# Patient Record
Sex: Male | Born: 1985 | Race: White | Hispanic: No | Marital: Single | Smoking: Former smoker
Health system: Southern US, Community
[De-identification: ages and names within clinical notes are randomized; demographics above are authoritative.]

## PROBLEM LIST (undated history)

## (undated) DIAGNOSIS — Z720 Tobacco use: Secondary | ICD-10-CM

## (undated) DIAGNOSIS — R569 Unspecified convulsions: Secondary | ICD-10-CM

## (undated) HISTORY — PX: MOUTH SURGERY: SHX715

---

## 2010-07-17 ENCOUNTER — Ambulatory Visit (INDEPENDENT_AMBULATORY_CARE_PROVIDER_SITE_OTHER): Payer: Managed Care, Other (non HMO)

## 2010-07-17 ENCOUNTER — Inpatient Hospital Stay (INDEPENDENT_AMBULATORY_CARE_PROVIDER_SITE_OTHER)
Admission: RE | Admit: 2010-07-17 | Discharge: 2010-07-17 | Disposition: A | Discharge status: Home / Self Care | Payer: Managed Care, Other (non HMO) | Source: Ambulatory Visit | Attending: Emergency Medicine | Admitting: Emergency Medicine

## 2010-07-17 DIAGNOSIS — S4350XA Sprain of unspecified acromioclavicular joint, initial encounter: Secondary | ICD-10-CM

## 2010-11-11 ENCOUNTER — Encounter: Payer: Self-pay | Admitting: *Deleted

## 2010-11-11 ENCOUNTER — Emergency Department (INDEPENDENT_AMBULATORY_CARE_PROVIDER_SITE_OTHER): Payer: Self-pay

## 2010-11-11 ENCOUNTER — Emergency Department (HOSPITAL_BASED_OUTPATIENT_CLINIC_OR_DEPARTMENT_OTHER)
Admission: EM | Admit: 2010-11-11 | Discharge: 2010-11-11 | Disposition: A | Discharge status: Home / Self Care | Payer: Self-pay | Attending: Emergency Medicine | Admitting: Emergency Medicine

## 2010-11-11 DIAGNOSIS — M79609 Pain in unspecified limb: Secondary | ICD-10-CM

## 2010-11-11 DIAGNOSIS — W2209XA Striking against other stationary object, initial encounter: Secondary | ICD-10-CM | POA: Insufficient documentation

## 2010-11-11 DIAGNOSIS — S92919A Unspecified fracture of unspecified toe(s), initial encounter for closed fracture: Secondary | ICD-10-CM

## 2010-11-11 DIAGNOSIS — IMO0002 Reserved for concepts with insufficient information to code with codable children: Secondary | ICD-10-CM

## 2010-11-11 DIAGNOSIS — F17200 Nicotine dependence, unspecified, uncomplicated: Secondary | ICD-10-CM | POA: Insufficient documentation

## 2010-11-11 NOTE — ED Notes (Signed)
Patients toes buddytaped. He voices no additional concerns at this time
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
## 2010-11-13 NOTE — ED Provider Notes (Signed)
History/physical exam/procedure(s) were performed by non-physician practitioner and as supervising physician I was immediately available for consultation/collaboration. I have reviewed all notes and am in agreement with care and plan.   Hilario Quarry, MD 11/13/10 1215

## 2016-11-01 ENCOUNTER — Encounter (HOSPITAL_COMMUNITY): Payer: Self-pay | Admitting: Emergency Medicine

## 2016-11-01 ENCOUNTER — Observation Stay (HOSPITAL_COMMUNITY)
Admission: EM | Admit: 2016-11-01 | Discharge: 2016-11-02 | Disposition: A | Discharge status: Home / Self Care | Payer: Self-pay | Attending: Internal Medicine | Admitting: Internal Medicine

## 2016-11-01 DIAGNOSIS — G40409 Other generalized epilepsy and epileptic syndromes, not intractable, without status epilepticus: Principal | ICD-10-CM | POA: Insufficient documentation

## 2016-11-01 DIAGNOSIS — Z79899 Other long term (current) drug therapy: Secondary | ICD-10-CM | POA: Insufficient documentation

## 2016-11-01 DIAGNOSIS — Z720 Tobacco use: Secondary | ICD-10-CM

## 2016-11-01 DIAGNOSIS — F17210 Nicotine dependence, cigarettes, uncomplicated: Secondary | ICD-10-CM | POA: Insufficient documentation

## 2016-11-01 DIAGNOSIS — M6282 Rhabdomyolysis: Secondary | ICD-10-CM | POA: Insufficient documentation

## 2016-11-01 DIAGNOSIS — R569 Unspecified convulsions: Secondary | ICD-10-CM

## 2016-11-01 DIAGNOSIS — N179 Acute kidney failure, unspecified: Secondary | ICD-10-CM | POA: Insufficient documentation

## 2016-11-01 DIAGNOSIS — G40909 Epilepsy, unspecified, not intractable, without status epilepticus: Secondary | ICD-10-CM

## 2016-11-01 HISTORY — DX: Tobacco use: Z72.0

## 2016-11-01 HISTORY — DX: Unspecified convulsions: R56.9

## 2016-11-01 NOTE — ED Triage Notes (Signed)
Pt BIB EMS from work after witness grand mal seizure, lasting approx 1min. Postictal for EMS arrival. Prior hx of seizure, but hasn't had one in 2-3071yrs. Had episode of bradycardia in the 40s for EMS, pale & diaphoretic. Given NS and placed into trendelenburg position immediately, and seemed to improve. Peak T waves reported on EKG, with variable HR.

## 2016-11-02 ENCOUNTER — Encounter (HOSPITAL_COMMUNITY): Payer: Self-pay | Admitting: Internal Medicine

## 2016-11-02 ENCOUNTER — Observation Stay (HOSPITAL_COMMUNITY): Payer: Self-pay

## 2016-11-02 DIAGNOSIS — Z720 Tobacco use: Secondary | ICD-10-CM | POA: Diagnosis present

## 2016-11-02 DIAGNOSIS — R569 Unspecified convulsions: Secondary | ICD-10-CM

## 2016-11-02 DIAGNOSIS — G40909 Epilepsy, unspecified, not intractable, without status epilepticus: Secondary | ICD-10-CM | POA: Insufficient documentation

## 2016-11-02 DIAGNOSIS — N179 Acute kidney failure, unspecified: Secondary | ICD-10-CM | POA: Diagnosis present

## 2016-11-02 LAB — CBG MONITORING, ED: GLUCOSE-CAPILLARY: 103 mg/dL — AB (ref 65–99)

## 2016-11-02 LAB — BASIC METABOLIC PANEL
ANION GAP: 8 (ref 5–15)
Anion gap: 8 (ref 5–15)
BUN: 11 mg/dL (ref 6–20)
BUN: 13 mg/dL (ref 6–20)
CALCIUM: 9.6 mg/dL (ref 8.9–10.3)
CO2: 25 mmol/L (ref 22–32)
CO2: 26 mmol/L (ref 22–32)
Calcium: 8.8 mg/dL — ABNORMAL LOW (ref 8.9–10.3)
Chloride: 107 mmol/L (ref 101–111)
Chloride: 108 mmol/L (ref 101–111)
Creatinine, Ser: 1.1 mg/dL (ref 0.61–1.24)
Creatinine, Ser: 1.29 mg/dL — ABNORMAL HIGH (ref 0.61–1.24)
GFR calc Af Amer: >60 mL/min (ref >60)
GFR calc non Af Amer: >60 mL/min (ref >60)
GLUCOSE: 116 mg/dL — AB (ref 65–99)
GLUCOSE: 99 mg/dL (ref 65–99)
POTASSIUM: 3.5 mmol/L (ref 3.5–5.1)
Potassium: 4 mmol/L (ref 3.5–5.1)
SODIUM: 142 mmol/L (ref 135–145)
Sodium: 140 mmol/L (ref 135–145)

## 2016-11-02 LAB — RAPID URINE DRUG SCREEN, HOSP PERFORMED
Amphetamines: NOT DETECTED
Barbiturates: NOT DETECTED
Benzodiazepines: NOT DETECTED
COCAINE: NOT DETECTED
Opiates: NOT DETECTED
Tetrahydrocannabinol: NOT DETECTED

## 2016-11-02 LAB — CBC
HEMATOCRIT: 42.8 % (ref 39.0–52.0)
HEMATOCRIT: 39.2 % (ref 39.0–52.0)
HEMOGLOBIN: 13.4 g/dL (ref 13.0–17.0)
Hemoglobin: 14.9 g/dL (ref 13.0–17.0)
MCH: 32.7 pg (ref 26.0–34.0)
MCH: 32 pg (ref 26.0–34.0)
MCHC: 34.8 g/dL (ref 30.0–36.0)
MCHC: 34.2 g/dL (ref 30.0–36.0)
MCV: 94.1 fL (ref 78.0–100.0)
MCV: 93.6 fL (ref 78.0–100.0)
PLATELETS: 205 10*3/uL (ref 150–400)
Platelets: 204 10*3/uL (ref 150–400)
RBC: 4.55 MIL/uL (ref 4.22–5.81)
RBC: 4.19 MIL/uL — ABNORMAL LOW (ref 4.22–5.81)
RDW: 12.5 % (ref 11.5–15.5)
RDW: 12.6 % (ref 11.5–15.5)
WBC: 7.7 10*3/uL (ref 4.0–10.5)
WBC: 10.2 10*3/uL (ref 4.0–10.5)

## 2016-11-02 LAB — SODIUM, URINE, RANDOM: Sodium, Ur: 124 mmol/L

## 2016-11-02 LAB — PHOSPHORUS
PHOSPHORUS: 3.2 mg/dL (ref 2.5–4.6)
Phosphorus: <1 mg/dL — CL (ref 2.5–4.6)
Phosphorus: 2.7 mg/dL (ref 2.5–4.6)

## 2016-11-02 LAB — URINALYSIS, ROUTINE W REFLEX MICROSCOPIC
Bilirubin Urine: NEGATIVE
Glucose, UA: NEGATIVE mg/dL
Hgb urine dipstick: NEGATIVE
KETONES UR: NEGATIVE mg/dL
LEUKOCYTES UA: NEGATIVE
NITRITE: NEGATIVE
PH: 5 (ref 5.0–8.0)
Protein, ur: NEGATIVE mg/dL
SPECIFIC GRAVITY, URINE: 1.016 (ref 1.005–1.030)

## 2016-11-02 LAB — CREATININE, URINE, RANDOM: Creatinine, Urine: 123.6 mg/dL

## 2016-11-02 LAB — CK: CK TOTAL: 849 U/L — AB (ref 49–397)

## 2016-11-02 LAB — MAGNESIUM: Magnesium: 2.2 mg/dL (ref 1.7–2.4)

## 2016-11-02 LAB — HIV ANTIBODY (ROUTINE TESTING W REFLEX): HIV Screen 4th Generation wRfx: NONREACTIVE

## 2016-11-02 MED ORDER — LEVETIRACETAM 500 MG/5ML IV SOLN
500.0000 mg | Freq: Two times a day (BID) | INTRAVENOUS | Status: DC
  Administered 2016-11-02: 500 mg via INTRAVENOUS
  Filled 2016-11-02 (×2): qty 5

## 2016-11-02 MED ORDER — ACETAMINOPHEN 325 MG PO TABS: 650.0000 mg | ORAL_TABLET | Freq: Four times a day (QID) | ORAL | Status: DC | PRN

## 2016-11-02 MED ORDER — ZOLPIDEM TARTRATE 5 MG PO TABS: 5.0000 mg | ORAL_TABLET | Freq: Every evening | ORAL | Status: DC | PRN

## 2016-11-02 MED ORDER — NICOTINE 21 MG/24HR TD PT24
21.0000 mg | MEDICATED_PATCH | Freq: Every day | TRANSDERMAL | Status: DC
  Administered 2016-11-02: 21 mg via TRANSDERMAL
  Filled 2016-11-02: qty 1

## 2016-11-02 MED ORDER — ENOXAPARIN SODIUM 40 MG/0.4ML ~~LOC~~ SOLN: 40.0000 mg | SUBCUTANEOUS | Status: DC

## 2016-11-02 MED ORDER — LORAZEPAM 2 MG/ML IJ SOLN: 1.0000 mg | INTRAMUSCULAR | Status: DC | PRN

## 2016-11-02 MED ORDER — ONDANSETRON HCL 4 MG PO TABS: 4.0000 mg | ORAL_TABLET | Freq: Four times a day (QID) | ORAL | Status: DC | PRN

## 2016-11-02 MED ORDER — LEVETIRACETAM 500 MG PO TABS: 500.0000 mg | ORAL_TABLET | Freq: Two times a day (BID) | ORAL | 0 refills | Status: DC

## 2016-11-02 MED ORDER — SODIUM CHLORIDE 0.9 % IV SOLN
INTRAVENOUS | Status: DC
  Administered 2016-11-02: 11:00:00 via INTRAVENOUS

## 2016-11-02 MED ORDER — POTASSIUM CHLORIDE CRYS ER 20 MEQ PO TBCR
40.0000 meq | EXTENDED_RELEASE_TABLET | Freq: Once | ORAL | Status: AC
Start: 2016-11-02 — End: 2016-11-02
  Administered 2016-11-02: 40 meq via ORAL
  Filled 2016-11-02: qty 2

## 2016-11-02 MED ORDER — SODIUM PHOSPHATES 45 MMOLE/15ML IV SOLN
20.0000 mmol | Freq: Once | INTRAVENOUS | Status: AC
  Administered 2016-11-02: 20 mmol via INTRAVENOUS
  Filled 2016-11-02: qty 6.67

## 2016-11-02 MED ORDER — ACETAMINOPHEN 650 MG RE SUPP: 650.0000 mg | Freq: Four times a day (QID) | RECTAL | Status: DC | PRN

## 2016-11-02 MED ORDER — NICOTINE 21 MG/24HR TD PT24: 21.0000 mg | MEDICATED_PATCH | Freq: Every day | TRANSDERMAL | 0 refills | Status: DC

## 2016-11-02 MED ORDER — LEVETIRACETAM 500 MG/5ML IV SOLN
1000.0000 mg | Freq: Once | INTRAVENOUS | Status: AC
  Administered 2016-11-02: 1000 mg via INTRAVENOUS
  Filled 2016-11-02: qty 10

## 2016-11-02 MED ORDER — ONDANSETRON HCL 4 MG/2ML IJ SOLN: 4.0000 mg | Freq: Four times a day (QID) | INTRAMUSCULAR | Status: DC | PRN

## 2016-11-02 NOTE — Discharge Summary (Signed)
Physician Discharge Summary  Blake Edwards UEA:[id number] DOB: 07/15/1985 DOA: 11/01/2016  PCP: Daisy Florooss, Charles Alan, MD  Admit date: 11/01/2016 Discharge date: 11/02/2016  Time spent: 45 minutes  Recommendations for Outpatient Follow-up:  Patient will be discharged to home.  Patient will need to follow up with primary care provider within one week of discharge, repeat BMP, magnesium, phosphate, CK levels in one week. Follow up with parathyroid hormond level and discuss low phosphate. Follow up with neurologist in 1-2 weeks. Patient should continue medications as prescribed.  Patient should follow a regular diet. No driving for 6 months.   Discharge Diagnoses:  Seizure Hypophosphatemia Acute kidney injury Tobacco abuse Mild rhabdomyolysis  Discharge Condition: stable  Diet recommendation: regular  Filed Weights   11/01/16 2327  Weight: 99.8 kg (220 lb)    History of present illness:  On 11/02/2016 by Dr. Lorretta HarpXilin Niu Blake Edwards is a 31 y.o. male with medical history significant of tobacco abuse, seizure, who presents with seizure.  Patient had a history of seizure, but did not have it for at least 5 years. Pt had another witnessed grand mal seizure at work at about 10:00 PM, lasting approx 1min. Pt was postictal at EMS arrival. Pt had an episode of bradycardia in the 40s per EMS. Pt was pale &diaphoretic. When I saw pt in ED. He is alert and oriented x 3.  No complaints. Patient denies unilateral weakness, numbness or tingling to extremities, no facial droop, slurred speech, vision or hearing loss. Denies chest pain, SOB, fever, chills, cough, nausea, vomiting, diarrhea, abdominal pain or symptoms of UTI.  Hospital Course:  Seizure -Patient with history of seizures however had not had one for 5 years. Was on carbamazepine in the past however was expensive and has not taken the medication in 5 years. -True etiology of seizure unclear, however patient was found to have hypophosphatemia  upon admission. (Would have expected other symptoms such as muscle aches and pains with such a low phosphate level <1 on admission.) -UDS unremarkable.  -Patient afebrile with no leukocytosis, UA unremarkable for infection, no c/o SOB/cough- infection unlikely to be a cause  -Phosphorus was repleted. -Potassium down to 3.5, although within normal limits will replace -Magnesium 2.2 -Patient was given 1 g of Keppra IV in emergency department and started on 500 mg twice daily -Neurology consulted and appreciated -MRI brain unremarkable -EEG normal -Discussed with neurology, recommended continuing Keppra with outpatient neurology follow-up -No driving for 6 months, this was relayed to the patient -Recommend repeat labs in one week  Hypophosphatemia -Replaced, phosphorus level currently 3.2 -unclear etiology, patient appears to be well-nourished -Nephrology consulted and appreciated, recommended checking PTH (which is currently pending and can be followed up on by PCP)  Acute kidney injury -Resolved, creatinine 1.29 on admission, currently 1.1 -repeat BMP in one week  Tobacco abuse -Cessation discussed, continue nicotine patch  Mild rhabdomyolysis -Secondary to seizure -CK 849 -Repeat in one week  Procedures: EEG  Consultations: Neurology Nephrology  Discharge Exam: Vitals:   11/02/16 0854 11/02/16 1303  BP: 111/68 111/69  Pulse: (!) 55 (!) 56  Resp: 18 18  Temp: 98.4 F (36.9 C) 98.6 F (37 C)  SpO2: 98% 100%   Patient feeling better today. Has no complaints of chest pain, shortness breath, abdominal pain, nausea or vomiting, diarrhea or constipation, dizziness or headache. Would like to be discharged.   General: Well developed, well nourished, NAD, appears stated age  HEENT: NCAT, mucous membranes moist.  Cardiovascular: S1 S2  auscultated, no rubs, murmurs or gallops. RRR  Respiratory: Clear to auscultation bilaterally with equal chest rise  Abdomen: Soft,  nontender, nondistended, + bowel sounds  Extremities: warm dry without cyanosis clubbing or edema  Neuro: AAOx3, Asymmetric visual gaze, nonfocal  Psych: Normal affect and demeanor with intact judgement and insight  Discharge Instructions Discharge Instructions    Discharge instructions    Complete by:  As directed    Patient will be discharged to home.  Patient will need to follow up with primary care provider within one week of discharge, repeat BMP, magnesium, phosphate, CK levels in one week. Follow up with parathyroid hormond level and discuss low phosphate. Follow up with neurologist in 1-2 weeks. Patient should continue medications as prescribed.  Patient should follow a regular diet. No driving for 6 months.   Driving Restrictions    Complete by:  As directed    No driving for 6 months.     Current Discharge Medication List    START taking these medications   Details  levETIRAcetam (KEPPRA) 500 MG tablet Take 1 tablet (500 mg total) by mouth 2 (two) times daily. Qty: 30 tablet, Refills: 0    nicotine (NICODERM CQ - DOSED IN MG/24 HOURS) 21 mg/24hr patch Place 1 patch (21 mg total) onto the skin daily. Qty: 28 patch, Refills: 0      STOP taking these medications     naproxen sodium (ANAPROX) 220 MG tablet        No Known Allergies Follow-up Information    [facility] Follow up in 1 week(s).   Specialty:  Neurology Contact information: [address] [place] [address] [phone]       Daisy Floro, MD. Schedule an appointment as soon as possible for a visit in 1 week(s).   Specialty:  Family Medicine Why:  Hospital follow up Contact information: [address] [place] [address] [phone]            The results of significant diagnostics from this hospitalization (including imaging, microbiology, ancillary and laboratory) are listed below for reference.    Significant Diagnostic  Studies: Mr Laqueta Jean Wo Contrast  Result Date: 11/02/2016 CLINICAL DATA:  Seizure, new, nontraumatic, 18-40 yrs EXAM: MRI HEAD WITHOUT AND WITH CONTRAST TECHNIQUE: Multiplanar, multiecho pulse sequences of the brain and surrounding structures were obtained without and with intravenous contrast. CONTRAST:  20 mL MultiHance IV COMPARISON:  None. FINDINGS: Brain: No acute infarction, hemorrhage, hydrocephalus, extra-axial collection or mass lesion. Normal enhancement postcontrast. Vascular: Normal arterial flow void Skull and upper cervical spine: Negative Sinuses/Orbits: Negative Other: None IMPRESSION: Normal MRI head with contrast Electronically Signed   By: Marlan Palau M.D.   On: 11/02/2016 07:05    Microbiology: No results found for this or any previous visit (from the past 240 hour(s)).   Labs: Basic Metabolic Panel:  Recent Labs Lab 11/01/16 2345 11/02/16 0511 11/02/16 0908  NA 142 140  --   K 4.0 3.5  --   CL 108 107  --   CO2 26 25  --   GLUCOSE 116* 99  --   BUN 13 11  --   CREATININE 1.29* 1.10  --   CALCIUM 9.6 8.8*  --   MG 2.2  --   --   PHOS <1.0* 2.7 3.2   Liver Function Tests: No results for input(s): AST, ALT, ALKPHOS, BILITOT, PROT, ALBUMIN in the last 168 hours. No results for input(s): LIPASE,  AMYLASE in the last 168 hours. No results for input(s): AMMONIA in the last 168 hours. CBC:  Recent Labs Lab 11/02/16 0215 11/02/16 0511  WBC 7.7 10.2  HGB 14.9 13.4  HCT 42.8 39.2  MCV 94.1 93.6  PLT 205 204   Cardiac Enzymes:  Recent Labs Lab 11/02/16 0908  CKTOTAL 849*   BNP: BNP (last 3 results) No results for input(s): BNP in the last 8760 hours.  ProBNP (last 3 results) No results for input(s): PROBNP in the last 8760 hours.  CBG:  Recent Labs Lab 11/02/16 0233  GLUCAP 103*       Signed:  Edsel Petrin  [facility] 11/02/2016, 1:50 PM

## 2016-11-02 NOTE — Progress Notes (Signed)
Same day progress note  S: Seen and examined. No complaints. Back to baseline  O: Vitals:   11/02/16 0545 11/02/16 0854  BP:  111/68  Pulse: (!) 53 (!) 55  Resp: 17 18  Temp:  98.4 F (36.9 C)  SpO2: 96% 98%  GEN: WD WN NAD HEENT: [place] AT MMM CVS: S1S2+, RRR RES: CTABL NEURO AAOx3 Speech clear. Naming, repetition and comprehension intact. CN 2-12 WNL Motor: 5/5 all over. Sensory: intact to LT Coord: FNF intact b/l Gait normal  MRI : WNL (reviewed by me) EEG: Completed, no abnormality  A:  Blake Edwards with PMH of multiple seizures for which he was on carbamazepine in his 6020s, presents with seizure and hypophosphatemia. Likely lowering of seizure threshold on an already susceptible brain by metabolic derangements.  Imp: Seizure Hypophosphatemia  Recs: -C/w Keppra 500 BID -Follow with OP Neurology -Correction of toxic metabolic derangements as you are. -d/c per primary team - no further neurological w/u at this time. -Seizure precautions as below.  [place] [facility] [place] [place] statutes, patients with seizures are not allowed to drive until they have been seizure-free for six months.   Use caution when using heavy equipment or power tools. Avoid working on ladders or at heights. Take showers instead of baths. Ensure the water temperature is not too high on the home water heater. Do not go swimming alone. Do not lock yourself in a room alone (i.e. bathroom). When caring for infants or small children, sit down when holding, feeding, or changing them to minimize risk of injury to the child in the event you have a seizure. Maintain good sleep hygiene. Avoid alcohol.   If patient has another seizure, call 911 and bring them back to the ED if: A. The seizure lasts longer than 5 minutes.  B. The patient doesn't wake shortly after the seizure or has new problems such as difficulty seeing, speaking or moving following the seizure C. The patient was injured during the seizure D.  The patient has a temperature over 102 F (39C) E. The patient vomited during the seizure and now is having trouble breathing  Milon DikesAshish Bellamy Judson, MD [facility] [phone]  If 7pm to 7am, please call on call as listed on AMION.  No charge note

## 2016-11-02 NOTE — ED Notes (Signed)
Attempted report x1.  Name and callback number provided.   

## 2016-11-02 NOTE — Procedures (Signed)
Date of recording 11/02/2016  [facility]  Technical Digital EEG recording using 10-20 international electrode system. EEG was performed during awake and drowsy photic stimulation and hyperventilation.  Brief history 31 year old male with seizures.  Description of the recording When awake posterior dominant rhythm is 9-10 Hz symmetrical, reactive, however sustained. Photic stimulation and hyperventilation did not produce any abnormal responses. Briefly non-REM stage II sleep wasn't obtained vertex transient and sleep spindles were seen. No localizing, lateralizing, interictal epileptiform features were seen during this recording  Impression The EEG is normal with patient recorded in awake and sleep states

## 2016-11-02 NOTE — Discharge Instructions (Signed)
Please start keppra for the seizures.  [employer] [place] law prevents people with seizures or fainting from driving or operating dangerous machinery until they are free of seizures or fainting for 6 months.   Epilepsy Epilepsy is a condition in which a person has repeated seizures over time. A seizure is a sudden burst of abnormal electrical and chemical activity in the brain. Seizures can cause a change in attention, behavior, or the ability to remain awake and alert (altered mental status). Epilepsy increases a person's risk of falls, accidents, and injury. It can also lead to complications, including:  Depression.  Poor memory.  Sudden unexplained death in epilepsy (SUDEP). This complication is rare, and its cause is not known.  Most people with epilepsy lead normal lives. What are the causes? This condition may be caused by:  A head injury.  An injury that happens at birth.  A high fever during childhood.  A stroke.  Bleeding that goes into or around the brain.  Certain medicines and drugs.  Having too little oxygen for a long period of time.  Abnormal brain development.  Certain infections, such as meningitis and encephalitis.  Brain tumors.  Conditions that are passed along from parent to child (are hereditary).  What are the signs or symptoms? Symptoms of a seizure vary greatly from person to person. They include:  Convulsions.  Stiffening of the body.  Involuntary movements of the arms or legs.  Loss of consciousness.  Breathing problems.  Falling suddenly.  Confusion.  Head nodding.  Eye blinking or fluttering.  Lip smacking.  Drooling.  Rapid eye movements.  Grunting.  Loss of bladder control and bowel control.  Staring.  Unresponsiveness.  Some people have symptoms right before a seizure happens (aura) and right after a seizure happens. Symptoms of an aura include:  Fear or anxiety.  Nausea.  Feeling like the room is spinning  (vertigo).  A feeling of having seen or heard something before (deja vu).  Odd tastes or smells.  Changes in vision, such as seeing flashing lights or spots.  Symptoms that follow a seizure include:  Confusion.  Sleepiness.  Headache.  How is this diagnosed? This condition is diagnosed based on:  Your symptoms.  Your medical history.  A physical exam.  A neurological exam. A neurological exam is similar to a physical exam. It involves checking your strength, reflexes, coordination, and sensations.  Tests, such as: ? An electroencephalogram (EEG). This is a painless test that creates a diagram of your brain waves. ? An MRI of the brain. ? A CT scan of the brain. ? A lumbar puncture, also called a spinal tap. ? Blood tests to check for signs of infection or abnormal blood chemistry.  How is this treated? There is no cure for this condition, but treatment can help control seizures. Treatment may involve:  Taking medicines to control seizures. These include medicines to prevent seizures and medicines to stop seizures as they occur.  Having a device called a vagus nerve stimulator implanted in the chest. The device sends electrical impulses to the vagus nerve and to the brain to prevent seizures. This treatment may be recommended if medicines do not help.  Brain surgery. There are several kinds of surgeries that may be done to stop seizures from happening or to reduce how often seizures happen.  Having regular blood tests. You may need to have blood tests regularly to check that you are getting the right amount of medicine.  Once this condition has  been diagnosed, it is important to begin treatment as soon as possible. For some people, epilepsy eventually goes away. Follow these instructions at home: Medicines   Take over-the-counter and prescription medicines only as told by your health care provider.  Avoid any substances that may prevent your medicine from working  properly, such as alcohol. Activity  Get enough rest. Lack of sleep can make seizures more likely to occur.  Follow instructions from your health care provider about driving, swimming, and doing any other activities that would be dangerous if you had a seizure. Educating others Teach friends and family what to do if you have a seizure. They should:  Lay you on the ground to prevent a fall.  Cushion your head and body.  Loosen any tight clothing around your neck.  Turn you on your side. If vomiting occurs, this helps keep your airway clear.  Stay with you until you recover.  Not hold you down. Holding you down will not stop the seizure.  Not put anything in your mouth.  Know whether or not you need emergency care.  General instructions  Avoid anything that has ever triggered a seizure for you.  Keep a seizure diary. Record what you remember about each seizure, especially anything that might have triggered the seizure.  Keep all follow-up visits as told by your health care provider. This is important. Contact a health care provider if:  Your seizure pattern changes.  You have symptoms of infection or another illness. This might increase your risk of having a seizure. Get help right away if:  You have a seizure that does not stop after 5 minutes.  You have several seizures in a row without a complete recovery in between seizures.  You have a seizure that makes it harder to breathe.  You have a seizure that is different from previous seizures.  You have a seizure that leaves you unable to speak or use a part of your body.  You did not wake up immediately after a seizure. This information is not intended to replace advice given to you by your health care provider. Make sure you discuss any questions you have with your health care provider. Document Released: 02/11/2005 Document Revised: 09/09/2015 Document Reviewed: 08/22/2015 Elsevier Interactive Patient Education   [employer]2018 [employer]

## 2016-11-02 NOTE — Progress Notes (Signed)
Pt discharging at this time with father taking all personal belongings. IV discontinued, dry dressing applied. Discharge instructions with prescriptions provided with verbal understanding. Pt will follow up with Md per dc summary. No noted distress.

## 2016-11-02 NOTE — Consult Note (Addendum)
Neurology Consultation Reason for Consult: Seizures Referring Physician: Rhunette Croft, A  CC: Seizures  History is obtained from: Patient  HPI: Tracer Gutridge is a 31 y.o. male with a history of seizures since his early 55s who has had 2-3 unprovoked seizures before. Due to not having one for several years, he stopped medication and has not had a seizure for several years since stopping.  He has been in good health, when today he was seen to have a "grand mal" seizure. He does not have any  memory of a prodrome, simply remembers being confused after the seizure and sore. You can return to baseline over about 20 minutes he thinks.  In the emergency department, he was found to have severe hypophosphatemia. He denies any prior symptoms of GI illness, weakness, numbness.  He states that he drinks, but only occasionally.  Of note, he does not think he had an MRI during his previous workup for seizures.  ROS: A 14 point ROS was performed and is negative except as noted in the HPI.   Past Medical History:  Diagnosis Date  . Seizures (HCC)   . Tobacco abuse      Family history: He is adopted so no family history is known.   Social History:  reports that he has been smoking Cigarettes.  He has a 1.50 pack-year smoking history. He has never used smokeless tobacco. He reports that he does not drink alcohol or use drugs.   Exam: Current vital signs: BP 104/67   Pulse 63   Temp 97.9 F (36.6 C) (Oral)   Resp 19   Ht 6' (1.829 m)   Wt 99.8 kg (220 lb)   SpO2 100%   BMI 29.84 kg/m  Vital signs in last 24 hours: Temp:  [97.9 F (36.6 C)] 97.9 F (36.6 C) (09/07 2326) Pulse Rate:  [52-90] 63 (09/08 0330) Resp:  [15-21] 19 (09/08 0330) BP: (104-137)/(65-96) 104/67 (09/08 0330) SpO2:  [82 %-100 %] 100 % (09/08 0330) Weight:  [99.8 kg (220 lb)] 99.8 kg (220 lb) (09/07 2327)   Physical Exam  Constitutional: Appears well-developed and well-nourished.  Psych: Affect appropriate to  situation Eyes: No scleral injection HENT: No OP obstrucion Head: Normocephalic.  Cardiovascular: Normal rate and regular rhythm.  Respiratory: Effort normal and breath sounds normal to anterior ascultation GI: Soft.  No distension. There is no tenderness.  Skin: WDI  Neuro: Mental Status: Patient is awake, alert, oriented to person, place, month, year, and situation. Patient is able to give a clear and coherent history. No signs of aphasia or neglect Cranial Nerves: II: Visual Fields are full. Pupils are equal, round, and reactive to light.   III,IV, VI: EOMI without ptosis or diploplia.  V: Facial sensation is symmetric to temperature VII: Facial movement is symmetric.  VIII: hearing is intact to voice X: Uvula elevates symmetrically XI: Shoulder shrug is symmetric. XII: tongue is midline without atrophy or fasciculations.  Motor: Tone is normal. Bulk is normal. 5/5 strength was present in all four extremities.  Sensory: Sensation is symmetric to light touch and temperature in the arms and legs. Deep Tendon Reflexes: 2+ and symmetric in the biceps and patellae.  Cerebellar: FNF  intact bilaterally   I have reviewed labs in epic and the results pertinent to this consultation are: BMP-borderline creatinine Magnesium-normal Phosphorus-less than 1.0  Impression: 31 year old male with a history of several unprovoked seizures in his early 20s who presents with seizure and hypophosphatemia. Hypophosphatemia can cause seizures, but typically  causes other symptoms such as muscle weakness first. I think that I would  Be hesitant to consider this solely a provoked seizure and I do favor starting antiepileptic therapy with his history of seizures.  Recommendations: 1) MRI brain with/without contrast 2) Keppra 500 mg twice a day 3) EEG 4) I discussed no driving for 6 months by [place] law.  5) treatment of low phosphate per IM 6) will follow.    Ritta SlotMcNeill Levoy Geisen, MD [facility] [phone]  If 7pm- 7am, please page neurology on call as listed in [facility].

## 2016-11-02 NOTE — Progress Notes (Signed)
CSW met with patient by request to discuss concern about not having insurance and having been admitted to the hospital. CSW explained that financial counseling handles concerns related to hospital billing; office isn't open on the weekends. CSW directed patient and patient's father at bedside to contact the hospital operator on Monday and ask to be directed to financial counseling to ask questions and obtain information.  Patient was also concerned about being able to afford his medication without his insurance, asked about any coupons. CSW printed out coupon for patient's Keppra from [employer] through his [employer].  CSW signing off.  Laveda Abbe, [place] Clinical Social Worker [phone]

## 2016-11-02 NOTE — ED Notes (Signed)
MRI called ready for patient.  

## 2016-11-02 NOTE — ED Provider Notes (Signed)
[facility]-EMERGENCY DEPT Provider Note   CSN: [id number] Arrival date & time: 11/01/16  2314     History   Chief Complaint Chief Complaint  Patient presents with  . Seizures    HPI Blake Edwards is a 31 y.o. male.  HPI Pt comes in with cc of seizures. Pt has hx of seizures, however, he hasnt had a seizure > 5 years. Today, whilst at work patient had a seizure. Pt doesn't recall the event, but the EMS reported generalized tonic clonic activity.  Pt uses marijuana occasionally, otherwise no drug use or drinking.    Past Medical History:  Diagnosis Date  . Seizures [facility])     Patient Active Problem List   Diagnosis Date Noted  . AKI (acute kidney injury) (HCC) 11/02/2016  . Hypophosphatemia 11/02/2016  . Seizures (HCC)     History reviewed. No pertinent surgical history.     Home Medications    Prior to Admission medications   Medication Sig Start Date End Date Taking? Authorizing Provider  levETIRAcetam (KEPPRA) 500 MG tablet Take 1 tablet (500 mg total) by mouth 2 (two) times daily. 11/02/16   Derwood Kaplan, MD    Family History History reviewed. No pertinent family history.  Social History Social History  Substance Use Topics  . Smoking status: Current Every Day Smoker    Packs/day: 0.50    Years: 3.00    Types: Cigarettes  . Smokeless tobacco: Never Used  . Alcohol use No     Allergies   Patient has no known allergies.   Review of Systems Review of Systems  Neurological: Positive for seizures.  All other systems reviewed and are negative.    Physical Exam Updated Vital Signs BP 104/67   Pulse 63   Temp 97.9 F (36.6 C) (Oral)   Resp 19   Ht 6' (1.829 m)   Wt 99.8 kg (220 lb)   SpO2 100%   BMI 29.84 kg/m   Physical Exam  Constitutional: He is oriented to person, place, and time. He appears well-developed.  HENT:  Head: Atraumatic.  Neck: Neck supple.  Cardiovascular: Normal rate.   Pulmonary/Chest: Effort normal.  Neurological:  He is alert and oriented to person, place, and time. No cranial nerve deficit. Coordination normal.  Skin: Skin is warm.  Nursing note and vitals reviewed.    ED Treatments / Results  Labs (all labs ordered are listed, but only abnormal results are displayed) Labs Reviewed  BASIC METABOLIC PANEL - Abnormal; Notable for the following:       Result Value   Glucose, Bld 116 (*)    Creatinine, Ser 1.29 (*)    All other components within normal limits  PHOSPHORUS - Abnormal; Notable for the following:    Phosphorus <1.0 (*)    All other components within normal limits  CBG MONITORING, ED - Abnormal; Notable for the following:    Glucose-Capillary 103 (*)    All other components within normal limits  CBC  RAPID URINE DRUG SCREEN, HOSP PERFORMED  MAGNESIUM  CREATININE, URINE, RANDOM  PARATHYROID HORMONE, INTACT (NO CA)  SODIUM, URINE, RANDOM    EKG  EKG Interpretation  Date/Time:  Friday November 01 2016 23:23:04 EDT Ventricular Rate:  81 PR Interval:    QRS Duration: 88 QT Interval:  361 QTC Calculation: 419 R Axis:   80 Text Interpretation:  Sinus rhythm peaked T wave in the anterior leads No old tracing to compare Confirmed by Derwood Kaplan [phone]) on 11/01/2016 11:43:49 PM  Radiology No results found.  Procedures Procedures (including critical care time)  CRITICAL CARE Performed by: Derwood KaplanNanavati, Natacia Chaisson   Total critical care time: 52 minutes for symptomatic severe hypophosphatemia  Critical care time was exclusive of separately billable procedures and treating other patients.  Critical care was necessary to treat or prevent imminent or life-threatening deterioration.  Critical care was time spent personally by me on the following activities: development of treatment plan with patient and/or surrogate as well as nursing, discussions with consultants, evaluation of patient's response to treatment, examination of patient, obtaining history from patient or  surrogate, ordering and performing treatments and interventions, ordering and review of laboratory studies, ordering and review of radiographic studies, pulse oximetry and re-evaluation of patient's condition.   Medications Ordered in ED Medications  sodium phosphate 20 mmol in dextrose 5 % 250 mL infusion (not administered)  0.9 %  sodium chloride infusion (not administered)  levETIRAcetam (KEPPRA) 1,000 mg in sodium chloride 0.9 % 100 mL IVPB (0 mg Intravenous Stopped 11/02/16 0302)     Initial Impression / Assessment and Plan / ED Course  I have reviewed the triage vital signs and the nursing notes.  Pertinent labs & imaging results that were available during my care of the patient were reviewed by me and considered in my medical decision making (see chart for details).     Pt comes in with cc of seizures. DDx: -Seizure disorder -Meningitis -Trauma -ICH -Electrolyte abnormality -Metabolic derangement -Stroke -Toxin induced seizures -Medication side effects -Hypoxia -Hypoglycemia  Since pt hasn't had a seizure > 5 years, we treated him as a first time seizure and ordered phosphorous, which is < 1. Spoke with Nephrology  -they recommend admission. Keppra given for the seizure. Neuro will see the patient as well. Phosphorous replacement started   Final Clinical Impressions(s) / ED Diagnoses   Final diagnoses:  Seizure disorder (HCC)  Hypophosphatemia  Seizures (HCC)    New Prescriptions New Prescriptions   LEVETIRACETAM (KEPPRA) 500 MG TABLET    Take 1 tablet (500 mg total) by mouth 2 (two) times daily.     Derwood KaplanNanavati, Rohnan Bartleson, MD 11/02/16 [id number]

## 2016-11-02 NOTE — Progress Notes (Signed)
Pt received this am with no noted distress. Pt stable, neuro intact. Pt oriented to room. Safety measures in place. Call bell within reach. Will continue to monitor.

## 2016-11-02 NOTE — Consult Note (Signed)
Referring Provider: No ref. provider found Primary Care Physician:  Daisy Floro, MD Primary Nephrologist:     Reason for Consultation:  Hypophosphatemia   HPI:  This is an otherwise healthy gentleman that works third shift at [employer], he was at work at about 10 pm when he experienced a "grand mal" seizure. He had a previous history of seizure disorders and was previously on seizure medications in his early 20's.  He was found in the ER and was found to have a phosphorus of < 1.0  Calcium 9.6 and Magnesium 2.2  He underwent administration of IV Sodium Phosphate and was taken for MR of Brain that was unremarkable Negative UDS Creatinine slightly increased at 1.29  He does not take any regular medications and does not drink alcohol regularly with no binge drinking behavior. He occasional smokes tobacco  A repeat phosphorus was measured of 2.4 although only a small amount of IV phosphorus was administrated. He has had no GI illness and no change in diet . His Appetite he states is good and in last year has gained over 30 lbs There is no complaints of paresthesias and no preceeding history of muscle weekness   Past Medical History:  Diagnosis Date  . Seizures (HCC)   . Tobacco abuse     Past Surgical History:  Procedure Laterality Date  . MOUTH SURGERY      Prior to Admission medications   Medication Sig Start Date End Date Taking? Authorizing Provider  levETIRAcetam (KEPPRA) 500 MG tablet Take 1 tablet (500 mg total) by mouth 2 (two) times daily. 11/02/16   Derwood Kaplan, MD    Current Facility-Administered Medications  Medication Dose Route Frequency Provider Last Rate Last Dose  . 0.9 %  sodium chloride infusion   Intravenous Continuous Lorretta Harp, MD      . acetaminophen (TYLENOL) tablet 650 mg  650 mg Oral Q6H PRN Lorretta Harp, MD       Or  . acetaminophen (TYLENOL) suppository 650 mg  650 mg Rectal Q6H PRN Lorretta Harp, MD      . enoxaparin (LOVENOX) injection 40 mg  40 mg  Subcutaneous Q24H Lorretta Harp, MD      . levETIRAcetam (KEPPRA) 500 mg in sodium chloride 0.9 % 100 mL IVPB  500 mg Intravenous Q12H Lorretta Harp, MD      . LORazepam (ATIVAN) injection 1 mg  1 mg Intravenous Q2H PRN Lorretta Harp, MD      . nicotine (NICODERM CQ - dosed in mg/24 hours) patch 21 mg  21 mg Transdermal Daily Lorretta Harp, MD      . ondansetron [facility]) tablet 4 mg  4 mg Oral Q6H PRN Lorretta Harp, MD       Or  . ondansetron (ZOFRAN) injection 4 mg  4 mg Intravenous Q6H PRN Lorretta Harp, MD      . sodium phosphate 20 mmol in dextrose 5 % 250 mL infusion  20 mmol Intravenous Once Derwood Kaplan, MD 43 mL/hr at 11/02/16 0442 20 mmol at 11/02/16 0442  . zolpidem (AMBIEN) tablet 5 mg  5 mg Oral QHS PRN Lorretta Harp, MD       Current Outpatient Prescriptions  Medication Sig Dispense Refill  . levETIRAcetam (KEPPRA) 500 MG tablet Take 1 tablet (500 mg total) by mouth 2 (two) times daily. 30 tablet 0    Allergies as of 11/01/2016  . (No Known Allergies)    History reviewed. No pertinent family history.  Social History  Social History  . Marital status: Single    Spouse name: N/A  . Number of children: N/A  . Years of education: N/A   Occupational History  . Not on file.   Social History Main Topics  . Smoking status: Current Every Day Smoker    Packs/day: 0.50    Years: 3.00    Types: Cigarettes  . Smokeless tobacco: Never Used  . Alcohol use No  . Drug use: No  . Sexual activity: Not on file   Other Topics Concern  . Not on file   Social History Narrative  . No narrative on file    Review of Systems: Gen: Denies any fever, chills, sweats, anorexia, fatigue, weakness, malaise, weight loss, and sleep disorder HEENT: No visual complaints, No history of Retinopathy. Normal external appearance No Epistaxis or Sore throat. No sinusitis.   CV: Denies chest pain, angina, palpitations, syncope, orthopnea, PND, peripheral edema, and claudication. Resp: Denies dyspnea at rest,  dyspnea with exercise, cough, sputum, wheezing, coughing up blood, and pleurisy. GI: Denies vomiting blood, jaundice, and fecal incontinence.   Denies dysphagia or odynophagia. GU : Denies urinary burning, blood in urine, urinary frequency, urinary hesitancy, nocturnal urination, and urinary incontinence.  No renal calculi. MS: Denies joint pain, limitation of movement, and swelling, stiffness, low back pain, extremity pain. Denies muscle weakness, cramps, atrophy.  No use of non steroidal antiinflammatory drugs. Derm: Denies rash, itching, dry skin, hives, moles, warts, or unhealing ulcers.  Psych: Denies depression, anxiety, memory loss, suicidal ideation, hallucinations, paranoia, and confusion. Heme: Denies bruising, bleeding, and enlarged lymph nodes. Neuro: Per HPI  Seizures in 20's unprovoked  Endocrine No DM.  No Thyroid disease.  No Adrenal disease.  Physical Exam: Vital signs in last 24 hours: Temp:  [97.9 F (36.6 C)] 97.9 F (36.6 C) (09/07 2326) Pulse Rate:  [52-142] 142 (09/08 0530) Resp:  [14-21] 18 (09/08 0530) BP: (104-137)/(62-96) 113/71 (09/08 0530) SpO2:  [82 %-100 %] 99 % (09/08 0530) Weight:  [220 lb (99.8 kg)] 220 lb (99.8 kg) (09/07 2327)   General:   Alert,  Well-developed, well-nourished, pleasant and cooperative in NAD Head:  Normocephalic and atraumatic. Eyes:  Sclera clear, no icterus.   Conjunctiva pink.  Slight asymmetry in visual gaze  Ears:  Normal auditory acuity. Nose:  No deformity, discharge,  or lesions. Mouth:  No deformity or lesions, dentition normal. Neck:  Supple; no masses or thyromegaly. JVP not elevated Lungs:  Clear throughout to auscultation.   No wheezes, crackles, or rhonchi. No acute distress. Heart:  Regular rate and rhythm; no murmurs, clicks, rubs,  or gallops. Abdomen:  Soft, nontender and nondistended. No masses, hepatosplenomegaly or hernias noted. Normal bowel sounds, without guarding, and without rebound.   Msk:  Symmetrical  without gross deformities. Normal posture. Pulses:  No carotid, renal, femoral bruits. DP and PT symmetrical and equal Extremities:  Without clubbing or edema. Neurologic:  Alert and  oriented x4;  grossly normal neurologically. Skin:  Intact without significant lesions or rashes. Cervical Nodes:  No significant cervical adenopathy. Psych:  Alert and cooperative. Normal mood and affect.  Intake/Output from previous day: 09/07 0701 - 09/08 0700 In: 100 [IV Piggyback:100] Out: -  Intake/Output this shift: No intake/output data recorded.  Lab Results:  Recent Labs  11/02/16 0215 11/02/16 0511  WBC 7.7 10.2  HGB 14.9 13.4  HCT 42.8 39.2  PLT 205 204   BMET  Recent Labs  11/01/16 2345 11/02/16 0511  NA 142  140  K 4.0 3.5  CL 108 107  CO2 26 25  GLUCOSE 116* 99  BUN 13 11  CREATININE 1.29* 1.10  CALCIUM 9.6 8.8*  PHOS <1.0* 2.7   LFT No results for input(s): PROT, ALBUMIN, AST, ALT, ALKPHOS, BILITOT, BILIDIR, IBILI in the last 72 hours. PT/INR No results for input(s): LABPROT, INR in the last 72 hours. Hepatitis Panel No results for input(s): HEPBSAG, HCVAB, HEPAIGM, HEPBIGM in the last 72 hours.  Studies/Results: Mr Laqueta Jean Wo Contrast  Result Date: 11/02/2016 CLINICAL DATA:  Seizure, new, nontraumatic, 18-40 yrs EXAM: MRI HEAD WITHOUT AND WITH CONTRAST TECHNIQUE: Multiplanar, multiecho pulse sequences of the brain and surrounding structures were obtained without and with intravenous contrast. CONTRAST:  20 mL MultiHance IV COMPARISON:  None. FINDINGS: Brain: No acute infarction, hemorrhage, hydrocephalus, extra-axial collection or mass lesion. Normal enhancement postcontrast. Vascular: Normal arterial flow void Skull and upper cervical spine: Negative Sinuses/Orbits: Negative Other: None IMPRESSION: Normal MRI head with contrast Electronically Signed   By: Marlan Palau M.D.   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 The patient does not seem to have had any use of medications that could provoke hypophosphatemia and appears fairly well nourished. The only clue appears to be a sudden unprovoked seizure in the setting of a history of seizure disorder 5 years ago. Primary hypoparathyroidism is possible and a PTH would be worth checking. There does not appear to be any redistribution of phosphorus from alkalosis or from refeeding syndrome. I wonder if he could be hypophosphatemic in the post- ictal period and would consider rechecking another phosphorus before restarting the IV. I will also check a CPK although rhabdomyolysis would probably lead to hyperphosphatemia   LOS: 0 Darrian Grzelak W  :44 AM

## 2016-11-02 NOTE — ED Notes (Signed)
Delay in lab draw MD at bedside. 

## 2016-11-02 NOTE — H&P (Signed)
History and Physical    Roper Tolson ZOX:[id number] DOB: [birth year] DOA: 11/01/2016  Referring MD/NP/PA:   PCP: Daisy Floro, MD   Patient coming from:  The patient is coming from home.  At baseline, pt is independent for most of ADL.  Chief Complaint: seizure  HPI: Blake Edwards is a 31 y.o. male with medical history significant of tobacco abuse, seizure, who presents with seizure.  Patient had a history of seizure, but did not have it for at least 5 years. Pt had another witnessed grand mal seizure at work at about 10:00 PM, lasting approx . Pt was postictal at EMS arrival. Pt had an episode of bradycardia in the 40s per EMS. Pt was pale & diaphoretic. When I saw pt in ED. He is alert and oriented x 3.  No complaints. Patient denies unilateral weakness, numbness or tingling to extremities, no facial droop, slurred speech, vision or hearing loss. Denies chest pain, SOB, fever, chills, cough, nausea, vomiting, diarrhea, abdominal pain or symptoms of UTI.  ED Course: pt was found to have hypophosphatemia with phosphorus<1.0, WBC 7.7, negative UDS, acute renal injury with creatinine 1.29, calcium 9.6, magnesium 2.2, temperature normal, variable heart rates, oxygen saturation 82 initially, which improved to 100% on room air. Patient is placed on telemetry bed for observation. Neurology, Dr. Amada Jupiter and nephrology, Dr. Hyman Hopes was consulted.  Review of Systems:   General: no fevers, chills, no body weight gain HEENT: no blurry vision, hearing changes or sore throat Respiratory: no dyspnea, coughing, wheezing CV: no chest pain, no palpitations GI: no nausea, vomiting, abdominal pain, diarrhea, constipation GU: no dysuria, burning on urination, increased urinary frequency, hematuria  Ext: no leg edema Neuro: no unilateral weakness, numbness, or tingling, no vision change or hearing loss. Had seizure. Skin: no rash, no skin tear. MSK: No muscle spasm, no deformity, no limitation of  range of movement in spin Heme: No easy bruising.  Travel history: No recent long distant travel.  Allergy: No Known Allergies  Past Medical History:  Diagnosis Date  . Seizures (HCC)   . Tobacco abuse     Past Surgical History:  Procedure Laterality Date  . MOUTH SURGERY      Social History:  reports that he has been smoking Cigarettes.  He has a 1.50 pack-year smoking history. He has never used smokeless tobacco. He reports that he does not drink alcohol or use drugs.  Family History:  pt is adopted  Prior to Admission medications   Medication Sig Start Date End Date Taking? Authorizing Provider  levETIRAcetam (KEPPRA) 500 MG tablet Take 1 tablet (500 mg total) by mouth 2 (two) times daily. 11/02/16   Derwood Kaplan, MD    Physical Exam: Vitals:   11/02/16 0200 11/02/16 0230 11/02/16 0300 11/02/16 0330  BP: 112/75 108/65 114/73 104/67  Pulse: 61 (!) 52 82 63  Resp: Temp:      TempSrc:      SpO2: 100% 99% (!) 82% 100%  Weight:      Height:       General: Not in acute distress HEENT:       Eyes: PERRL, EOMI, no scleral icterus.       ENT: No discharge from the ears and nose, no pharynx injection, no tonsillar enlargement.        Neck: No JVD, no bruit, no mass felt. Heme: No neck lymph node enlargement. Cardiac: S1/S2, RRR, No murmurs, No gallops or rubs. Respiratory: No rales,  wheezing, rhonchi or rubs. GI: Soft, nondistended, nontender, no rebound pain, no organomegaly, BS present. GU: No hematuria Ext: No pitting leg edema bilaterally. 2+DP/PT pulse bilaterally. Musculoskeletal: No joint deformities, No joint redness or warmth, no limitation of ROM in spin. Skin: No rashes.  Neuro: Alert, oriented X3, cranial nerves II-XII grossly intact, moves all extremities normally. Muscle strength 5/5 in all extremities, sensation to light touch intact. Brachial reflex 2+ bilaterally. Negative Babinski's sign.  Psych: Patient is not psychotic, no suicidal or  hemocidal ideation.  Labs on Admission: I have personally reviewed following labs and imaging studies  CBC:  Recent Labs Lab 11/02/16 0215  WBC 7.7  HGB 14.9  HCT 42.8  MCV 94.1  PLT 205   Basic Metabolic Panel:  Recent Labs Lab 11/01/16 2345  NA 142  K 4.0  CL 108  CO2 26  GLUCOSE 116*  BUN 13  CREATININE 1.29*  CALCIUM 9.6  MG 2.2  PHOS <1.0*   GFR: Estimated Creatinine Clearance: 100.6 mL/min (A) (by C-G formula based on SCr of 1.29 mg/dL (H)). Liver Function Tests: No results for input(s): AST, ALT, ALKPHOS, BILITOT, PROT, ALBUMIN in the last 168 hours. No results for input(s): LIPASE, AMYLASE in the last 168 hours. No results for input(s): AMMONIA in the last 168 hours. Coagulation Profile: No results for input(s): INR, PROTIME in the last 168 hours. Cardiac Enzymes: No results for input(s): CKTOTAL, CKMB, CKMBINDEX, TROPONINI in the last 168 hours. BNP (last 3 results) No results for input(s): PROBNP in the last 8760 hours. HbA1C: No results for input(s): HGBA1C in the last 72 hours. CBG:  Recent Labs Lab 11/02/16 0233  GLUCAP 103*   Lipid Profile: No results for input(s): CHOL, HDL, LDLCALC, TRIG, CHOLHDL, LDLDIRECT in the last 72 hours. Thyroid Function Tests: No results for input(s): TSH, T4TOTAL, FREET4, T3FREE, THYROIDAB in the last 72 hours. Anemia Panel: No results for input(s): VITAMINB12, FOLATE, FERRITIN, TIBC, IRON, RETICCTPCT in the last 72 hours. Urine analysis: No results found for: COLORURINE, APPEARANCEUR, LABSPEC, PHURINE, GLUCOSEU, HGBUR, BILIRUBINUR, KETONESUR, PROTEINUR, UROBILINOGEN, NITRITE, LEUKOCYTESUR Sepsis Labs: (procalcitonin:4,lacticidven:4) )No results found for this or any previous visit (from the past 240 hour(s)).   Radiological Exams on Admission: No results found.   EKG: Independently reviewed.  Sinus rhythm, QTC 415, T-wave peaking, elevation of J point  Assessment/Plan Principal Problem:    Seizures (HCC) Active Problems:   AKI (acute kidney injury) (HCC)   Hypophosphatemia   Tobacco abuse   Seizures (HCC): Patient had history of seizure, but did not have it for at least 5 years. Etiology for this recurrent seizures is not clear. Patient was found to have hypophosphatemia, which could be the reason. Will  Need to r/o other possibilities, such as intracranial tumor. Neurology, Dr. Amada Jupiter and nephrology, Dr. Hyman Hopes were consulted. Dr. Amada Jupiter recommended to get MRI of brain. Dr. recommended to get urine phosphorus level  -will place on tele bed for obs -highly appreciate neurologist and nephrologist recommendations -seizure precaution -start Keppra, 1g by IV and then 500 mg bid -Seizure precaution -Ativan when necessary for seizure -Check PTH, urine phosphorus -MRI of brain with and without contrast  Hypophosphatemia: -will give sodium phosphates, 20 mmol by IV -F/u  PTH, urine phosphorus  AKI: pt's Cre is 1.29, BUN 13 on admission. Ratio is not consistent with prerenal failure. - IVF: 125 cc/h of NS - Check FeNa - check UA - Follow up renal function by BMP  Tobacco abuse: -Did counseling about importance  of quitting smoking -Nicotine patch  DVT ppx: SQ Lovenox Code Status: Full code Family Communication: None at bed side.   Disposition Plan:  Anticipate discharge back to previous home environment Consults called:  Neurology, Dr. Amada JupiterKirkpatrick and nephrology, Dr. level Admission status: Obs / tele    Date of Service 11/02/2016    Lorretta HarpIU, Mekala Winger [facility] Pager [phone]  If 7PM-7AM, please contact night-coverage [email or web address] Password [email or web address] 11/02/2016, 5:30 AM

## 2016-11-02 NOTE — ED Notes (Signed)
ED Provider at bedside. 

## 2016-11-02 NOTE — Progress Notes (Signed)
EEG completed; results pending.    

## 2016-11-02 NOTE — ED Notes (Signed)
Patient transported to MRI 

## 2016-11-03 LAB — PARATHYROID HORMONE, INTACT (NO CA): PTH: 22 pg/mL (ref 15–65)

## 2017-04-11 ENCOUNTER — Emergency Department (HOSPITAL_COMMUNITY)
Admission: EM | Admit: 2017-04-11 | Discharge: 2017-04-11 | Discharge status: Against Medical Advice | Payer: Self-pay | Attending: Emergency Medicine | Admitting: Emergency Medicine

## 2017-04-11 ENCOUNTER — Other Ambulatory Visit: Payer: Self-pay

## 2017-04-11 ENCOUNTER — Encounter (HOSPITAL_COMMUNITY): Payer: Self-pay | Admitting: Emergency Medicine

## 2017-04-11 DIAGNOSIS — F17210 Nicotine dependence, cigarettes, uncomplicated: Secondary | ICD-10-CM | POA: Insufficient documentation

## 2017-04-11 DIAGNOSIS — R569 Unspecified convulsions: Secondary | ICD-10-CM | POA: Insufficient documentation

## 2017-04-11 NOTE — ED Triage Notes (Signed)
[place] [employer] [place] with c/o seizure that was witnessed by shoppers. Onlookers report pt fell to the floor and convulsed. Pt denies pain anywhere. No obvious injuries on body or in mouth. Pt alert and oriented x 4. Pt reports he takes medication for seizure, but can not remember the name and does not take it daily. Pt reports rarely uses etoh and thc but used both last night. Pt denies recent illness.

## 2017-04-11 NOTE — ED Notes (Signed)
Bed: WA20 Expected date:  Expected time:  Means of arrival:  Comments: EMS 32 yo male seizure

## 2017-04-11 NOTE — ED Provider Notes (Signed)
[facility]-EMERGENCY DEPT Provider Note   CSN: [id number] Arrival date & time: 04/11/17  [id number]     History   Chief Complaint Chief Complaint  Patient presents with  . Seizures    HPI Corinna GabJames Cast is a 32 y.o. male.  HPI   32 year old male brought here via EMS from work for evaluation of a witnessed seizure.  Patient reports he had a known history of seizure, last diagnosed last year when he had a grand mal seizure.  He was prescribed Keppra.  He admits that he has not been compliant with his medication but did took it last night.  He was working at [employer] when he had a what appears to be a seizure episode witnessed by shopper.  Currently patient denies having any active pain and he is requesting to leave.  He admits that he did use alcohol and marijuana last night but denies using it on  regular basis.  He denies any active pain, or confusion.  He reportedly seen his neurologist 3 months ago for follow-up.  History is otherwise limited as patient prefers to leave AGAINST MEDICAL ADVICE.  Past Medical History:  Diagnosis Date  . Seizures (HCC)   . Tobacco abuse     Patient Active Problem List   Diagnosis Date Noted  . AKI (acute kidney injury) (HCC) 11/02/2016  . Hypophosphatemia 11/02/2016  . Seizures (HCC)   . Tobacco abuse   . Seizure disorder [facility](HCC)     Past Surgical History:  Procedure Laterality Date  . MOUTH SURGERY         Home Medications    Prior to Admission medications   Medication Sig Start Date End Date Taking? Authorizing Provider  levETIRAcetam (KEPPRA) 500 MG tablet Take 1 tablet (500 mg total) by mouth 2 (two) times daily. 11/02/16   Mikhail, Nita SellsMaryann, DO  nicotine (NICODERM CQ - DOSED IN MG/24 HOURS) 21 mg/24hr patch Place 1 patch (21 mg total) onto the skin daily. 11/03/16   Edsel PetrinMikhail, Maryann, DO    Family History No family history on file.  Social History Social History   Tobacco Use  . Smoking status: Current Every Day  Smoker    Packs/day: 0.50    Years: 3.00    Pack years: 1.50    Types: Cigarettes  . Smokeless tobacco: Never Used  Substance Use Topics  . Alcohol use: Yes  . Drug use: Yes    Types: Marijuana     Allergies   Patient has no known allergies.   Review of Systems Review of Systems  All other systems reviewed and are negative.    Physical Exam Updated Vital Signs BP 133/70 (BP Location: Left Arm)   Pulse (!) 128   Temp 98.1 F (36.7 C) (Oral)   Resp 16   SpO2 99%   Physical Exam  Constitutional: He appears well-developed and well-nourished. No distress.  HENT:  Head: Atraumatic.  Eyes: Conjunctivae are normal.  Neck: Neck supple.  C-collar in place  Neurological: He is alert.  Skin: No rash noted.  Psychiatric: He has a normal mood and affect.  Nursing note and vitals reviewed.    ED Treatments / Results  Labs (all labs ordered are listed, but only abnormal results are displayed) Labs Reviewed  CBG MONITORING, ED    EKG  EKG Interpretation None       Radiology No results found.  Procedures Procedures (including critical care time)  Medications Ordered in ED Medications - No data to  display   Initial Impression / Assessment and Plan / ED Course  I have reviewed the triage vital signs and the nursing notes.  Pertinent labs & imaging results that were available during my care of the patient were reviewed by me and considered in my medical decision making (see chart for details).     BP 133/70 (BP Location: Left Arm)   Pulse (!) 128   Temp 98.1 F (36.7 C) (Oral)   Resp 16   SpO2 99%    Final Clinical Impressions(s) / ED Diagnoses   Final diagnoses:  Seizure [facility])    ED Discharge Orders    None     7:35 AM Patient with known history of seizure diagnosed last year currently on Keppra who is here for evaluation of a witnessed seizure episode.  At this time he appears to be communicating appropriately and does not appears to be in  postictal state.  He did admits to using marijuana and alcohol last night as well as having been compliant with his medication.  At this time he refused to be evaluated further and prefer to leave AGAINST MEDICAL ADVICE.  He understand the risks and understand that he is welcome to return at any time for further management.  I encourage patient to follow-up with a neurologist and be compliant with his medication.  He is able to make informed decision and his father will take patient home.  I urged patient not to drive.  Care discussed with Dr. Rodena Medin.   Fayrene Helper, PA-C 04/11/17 1610    Wynetta Fines, MD 04/11/17 [phone]

## 2017-12-20 ENCOUNTER — Emergency Department (HOSPITAL_COMMUNITY)
Admission: EM | Admit: 2017-12-20 | Discharge: 2017-12-20 | Disposition: A | Discharge status: Home / Self Care | Payer: Self-pay | Attending: Emergency Medicine | Admitting: Emergency Medicine

## 2017-12-20 ENCOUNTER — Other Ambulatory Visit: Payer: Self-pay

## 2017-12-20 ENCOUNTER — Encounter (HOSPITAL_COMMUNITY): Payer: Self-pay

## 2017-12-20 DIAGNOSIS — S0181XA Laceration without foreign body of other part of head, initial encounter: Secondary | ICD-10-CM | POA: Insufficient documentation

## 2017-12-20 DIAGNOSIS — R569 Unspecified convulsions: Secondary | ICD-10-CM | POA: Insufficient documentation

## 2017-12-20 DIAGNOSIS — W19XXXA Unspecified fall, initial encounter: Secondary | ICD-10-CM | POA: Insufficient documentation

## 2017-12-20 DIAGNOSIS — Z79899 Other long term (current) drug therapy: Secondary | ICD-10-CM | POA: Insufficient documentation

## 2017-12-20 DIAGNOSIS — E876 Hypokalemia: Secondary | ICD-10-CM | POA: Insufficient documentation

## 2017-12-20 DIAGNOSIS — Z9114 Patient's other noncompliance with medication regimen: Secondary | ICD-10-CM | POA: Insufficient documentation

## 2017-12-20 DIAGNOSIS — Y929 Unspecified place or not applicable: Secondary | ICD-10-CM | POA: Insufficient documentation

## 2017-12-20 DIAGNOSIS — F17210 Nicotine dependence, cigarettes, uncomplicated: Secondary | ICD-10-CM | POA: Insufficient documentation

## 2017-12-20 DIAGNOSIS — Y999 Unspecified external cause status: Secondary | ICD-10-CM | POA: Insufficient documentation

## 2017-12-20 DIAGNOSIS — G40909 Epilepsy, unspecified, not intractable, without status epilepticus: Secondary | ICD-10-CM

## 2017-12-20 DIAGNOSIS — Y9389 Activity, other specified: Secondary | ICD-10-CM | POA: Insufficient documentation

## 2017-12-20 LAB — COMPREHENSIVE METABOLIC PANEL
ALBUMIN: 4.5 g/dL (ref 3.5–5.0)
ALK PHOS: 53 U/L (ref 38–126)
ALT: 24 U/L (ref 0–44)
ANION GAP: 8 (ref 5–15)
AST: 20 U/L (ref 15–41)
BUN: 12 mg/dL (ref 6–20)
CALCIUM: 9 mg/dL (ref 8.9–10.3)
CHLORIDE: 106 mmol/L (ref 98–111)
CO2: 27 mmol/L (ref 22–32)
CREATININE: 1.07 mg/dL (ref 0.61–1.24)
GFR calc non Af Amer: >60 mL/min (ref >60)
GLUCOSE: 103 mg/dL — AB (ref 70–99)
Potassium: 3.4 mmol/L — ABNORMAL LOW (ref 3.5–5.1)
SODIUM: 141 mmol/L (ref 135–145)
Total Bilirubin: 0.4 mg/dL (ref 0.3–1.2)
Total Protein: 7.1 g/dL (ref 6.5–8.1)

## 2017-12-20 LAB — PHOSPHORUS: Phosphorus: 1 mg/dL — CL (ref 2.5–4.6)

## 2017-12-20 MED ORDER — POTASSIUM PHOSPHATE MONOBASIC 500 MG PO TABS
500.0000 mg | ORAL_TABLET | Freq: Once | ORAL | Status: DC
  Filled 2017-12-20: qty 1

## 2017-12-20 MED ORDER — CARBAMAZEPINE 200 MG PO TABS
200.0000 mg | ORAL_TABLET | Freq: Once | ORAL | Status: AC
  Administered 2017-12-20: 200 mg via ORAL
  Filled 2017-12-20: qty 1

## 2017-12-20 MED ORDER — POTASSIUM PHOSPHATE MONOBASIC 500 MG PO TABS: 500.0000 mg | ORAL_TABLET | Freq: Two times a day (BID) | ORAL | 0 refills | Status: AC

## 2017-12-20 MED ORDER — POTASSIUM PHOSPHATE MONOBASIC 500 MG PO TABS: 500.0000 mg | ORAL_TABLET | Freq: Two times a day (BID) | ORAL | 0 refills | Status: DC

## 2017-12-20 MED ORDER — CARBAMAZEPINE 200 MG PO TABS: ORAL_TABLET | ORAL | 0 refills | Status: DC

## 2017-12-20 MED ORDER — LIDOCAINE HCL (PF) 1 % IJ SOLN
5.0000 mL | Freq: Once | INTRAMUSCULAR | Status: AC
  Administered 2017-12-20: 5 mL
  Filled 2017-12-20: qty 30

## 2017-12-20 MED ORDER — POTASSIUM PHOSPHATE MONOBASIC 500 MG PO TABS
1000.0000 mg | ORAL_TABLET | Freq: Once | ORAL | Status: AC
  Administered 2017-12-20: 1000 mg via ORAL
  Filled 2017-12-20: qty 2

## 2017-12-20 NOTE — Discharge Instructions (Signed)
Take the medication as prescribed.  Call the number on these instructions in 2 days to get a primary care physician  Your blood potassium and blood phosphorus should be rechecked within the next 2 weeks.  Call [place] [facility] office or [facility] to arrange to get a neurologist or see you can see the neurologist of your choice.  Your blood Tegretol Carbamezepine level should be checked within the next 2 weeks as well.  Blood level can be checked by primary care physician or by neurologist.  Sutures to come out in a week you can go to an urgent care center to get sutures taken out

## 2017-12-20 NOTE — ED Notes (Signed)
Bed: WA03 Expected date: 12/20/17 Expected time: 7:32 AM Means of arrival: Ambulance Comments: EMS 32 yo male from work/seizure-lac left chin-no blood thinners

## 2017-12-20 NOTE — ED Triage Notes (Signed)
EMS from work, seizure, hx of. Pt states has seizure approximately once per year. Does not take any medication. Fall with laceration to left side of chin. A&O x 3. No other obvious injuries.  BP 140/82 HR 100 RR 18 Sp02 98 RA CBG 128  20 LAC

## 2017-12-20 NOTE — ED Provider Notes (Signed)
[facility]-EMERG[facility] DEPT Provider Note   CSN: [id number] Arrival date & time: 12/20/17  0732     History   Chief Complaint Chief Complaint  Patient presents with  . Seizures  . Facial Laceration    HPI Blake Edwards is a 32 y.o. male.  HPI Patient has seizure earlier tonight.  He has no recall of event.  He suffered chin laceration as result of the fall.  He is otherwise asymptomatic.  Denies headache denies neck pain denies recent drug or alcohol use, Past Medical History:  Diagnosis Date  . Seizures (HCC)   . Tobacco abuse     Patient Active Problem List   Diagnosis Date Noted  . AKI (acute kidney injury) (HCC) 11/02/2016  . Hypophosphatemia 11/02/2016  . Seizures (HCC)   . Tobacco abuse   . Seizure disorder [facility])     Past Surgical History:  Procedure Laterality Date  . MOUTH SURGERY          Home Medications    Prior to Admission medications   Medication Sig Start Date End Date Taking? Authorizing Provider  levETIRAcetam (KEPPRA) 500 MG tablet Take 1 tablet (500 mg total) by mouth 2 (two) times daily. 11/02/16  Yes Mikhail, [place], DO   Has not taken Keppra in 4 or 5 months.  Reports that it caused disagreeable mood.  Took himself off of seizure medication.  He states that Tegretol worked better for him Family History History reviewed. No pertinent family history.  Social History Social History   Tobacco Use  . Smoking status: Current Every Day Smoker    Packs/day: 0.50    Years: 3.00    Pack years: 1.50    Types: Cigarettes  . Smokeless tobacco: Never Used  Substance Use Topics  . Alcohol use: Yes  . Drug use: Yes    Types: Marijuana     Allergies   Patient has no known allergies.   Review of Systems Review of Systems  Constitutional: Negative.   HENT: Negative.   Respiratory: Negative.   Cardiovascular: Negative.   Gastrointestinal: Negative.   Musculoskeletal: Negative.   Skin: Positive for wound.   Chin laceration  Allergic/Immunologic:       Current on tetanus immunization  Neurological: Positive for seizures.  Psychiatric/Behavioral: Negative.   All other systems reviewed and are negative.    Physical Exam Updated Vital Signs BP (!) 125/95   Pulse 100   Temp 98.1 F (36.7 C) (Oral)   Resp 16   Ht 6' (1.829 m)   Wt 72.6 kg   SpO2 100%   BMI 21.70 kg/m   Physical Exam  Constitutional: He is oriented to person, place, and time. He appears well-developed and well-nourished.  HENT:  4 cm linear laceration left side of chin.  No surrounding soft tissue swelling..  No trismus.  No bony tenderness or jaw.  No malocclusion of teeth.  Eyes: Pupils are equal, round, and reactive to light. Conjunctivae are normal.  Neck: Neck supple. No tracheal deviation present. No thyromegaly present.  Cardiovascular: Normal rate and regular rhythm.  No murmur heard. Pulmonary/Chest: Effort normal and breath sounds normal.  Abdominal: Soft. Bowel sounds are normal. He exhibits no distension. There is no tenderness.  Musculoskeletal: Normal range of motion. He exhibits no edema or tenderness.  Neurological: He is alert and oriented to person, place, and time. Coordination normal.  Gait normal Romberg normal pronator drift normal.  Glasgow Coma Score 15  Skin: Skin is warm  and dry. No rash noted.  Psychiatric: He has a normal mood and affect.  Nursing note and vitals reviewed.    ED Treatments / Results  Labs (all labs ordered are listed, but only abnormal results are displayed) Labs Reviewed  COMPREHENSIVE METABOLIC PANEL  PHOSPHORUS    EKG None  Radiology No results found.  Procedures Procedures (including critical care time)  Medications Ordered in ED Medications  lidocaine (PF) (XYLOCAINE) 1 % injection 5 mL (has no administration in time range)  carbamazepine (TEGRETOL) tablet 200 mg (200 mg Oral Given 12/20/17 0902)    . Results for orders placed or performed during  the hospital encounter of 12/20/17  Comprehensive metabolic panel  Result Value Ref Range   Sodium 141 135 - 145 mmol/L   Potassium 3.4 (L) 3.5 - 5.1 mmol/L   Chloride 106 98 - 111 mmol/L   CO2 27 22 - 32 mmol/L   Glucose, Bld 103 (H) 70 - 99 mg/dL   BUN 12 6 - 20 mg/dL   Creatinine, Ser 1.61 0.61 - 1.24 mg/dL   Calcium 9.0 8.9 - 09.6 mg/dL   Total Protein 7.1 6.5 - 8.1 g/dL   Albumin 4.5 3.5 - 5.0 g/dL   AST 20 15 - 41 U/L   ALT 24 0 - 44 U/L   Alkaline Phosphatase 53 38 - 126 U/L   Total Bilirubin 0.4 0.3 - 1.2 mg/dL   GFR calc non Af Amer >60 >60 mL/min   GFR calc Af Amer >60 >60 mL/min   Anion gap 8 5 - 15  Phosphorus  Result Value Ref Range   Phosphorus 1.0 (LL) 2.5 - 4.6 mg/dL   No results found. Initial Impression / Assessment and Plan / ED Course  I have reviewed the triage vital signs and the nursing notes.  Pertinent labs & imaging results that were available during my care of the patient were reviewed by me and considered in my medical decision making (see chart for details).     Laceration repaired by physician assistant Ms.Khatri  Procedure note on chart. Patient received oral potassium and phosphorus supplementation in the ED.  Plan prescriptions K-Phos, Tegretol.  Referral primary care referral neurology sutures out 1 week 10:30 AM patient is alert Glasgow Coma Score 15 and in no distress Final Clinical Impressions(s) / ED Diagnoses  Diagnosis #1 seizure disorder #2 medication noncompliance #3 hypophosphatemia #4 hypokalemia #5 chin laceration Final diagnoses:  None    ED Discharge Orders    None       Doug Sou, MD 12/20/17 1040

## 2017-12-20 NOTE — ED Provider Notes (Signed)
LACERATION REPAIR Performed by: Dietrich Pates Authorized by: Dietrich Pates Consent: Verbal consent obtained. Risks and benefits: risks, benefits and alternatives were discussed Consent given by: patient Patient identity confirmed: provided demographic data Prepped and Draped in normal sterile fashion Wound explored  Laceration Location: chin  Laceration Length: 4cm  No Foreign Bodies seen or palpated  Anesthesia: local infiltration  Local anesthetic: lidocaine 1% without epinephrine  Anesthetic total: 5 ml  Irrigation method: syringe Amount of cleaning: standard  Skin closure: sutures; Prolene  Number of sutures: 5  Technique: simple interrupted  Patient tolerance: Patient tolerated the procedure well with no immediate complications.   Portions of this note were generated with Scientist, clinical (histocompatibility and immunogenetics). Dictation errors may occur despite best attempts at proofreading.    Dietrich Pates, PA-C 12/20/17 1610    Doug Sou, MD 12/20/17 [phone]

## 2018-06-30 IMAGING — MR MR HEAD WO/W CM
11 of 14 series · 30 of 48 positions shown · IV contrast (multihance)
Comparison: None.

CLINICAL DATA: Seizure, new, nontraumatic, 18-40 yrs

EXAM:
MRI HEAD WITHOUT AND WITH CONTRAST
TECHNIQUE: Multiplanar, multiecho pulse sequences of the brain and surrounding
structures were obtained without and with intravenous contrast.
CONTRAST:  20 mL MultiHance IV

[Series 3: DWI · axial · 3.0mm · 0.94mm/px · z∈[-43,+102]mm · 6 of 100 slices shown (1 of 2)]
[im 1/100]
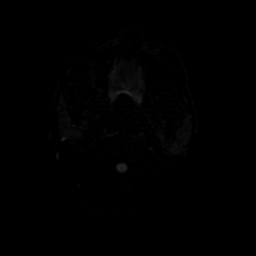
[im 20/100]
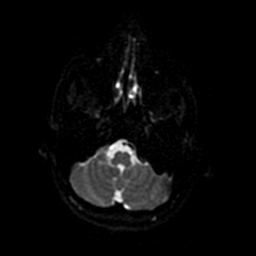
[im 40/100]
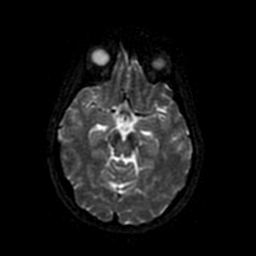
[im 60/100]
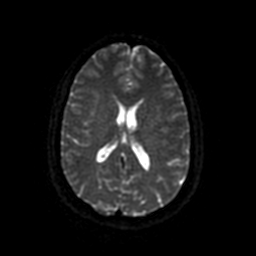
[im 80/100]
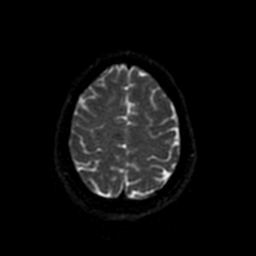
[im 100/100]
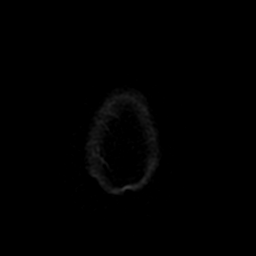

[Series 4: DWI · coronal · 4.0mm · 0.94mm/px · 4 of 72 slices shown (2 of 2)]
[im 1/72]
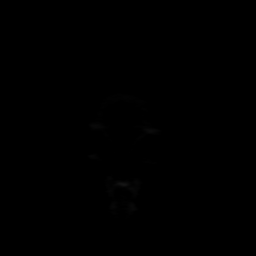
[im 24/72]
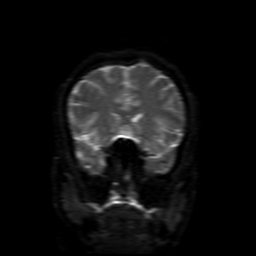
[im 48/72]
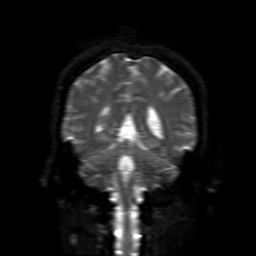
[im 72/72]
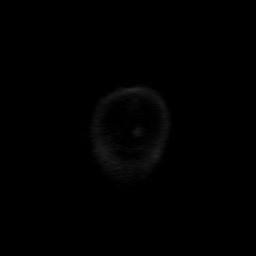

[Series 5: FLAIR · sagittal · 5.0mm · 0.47mm/px · 2 of 23 slices shown (1 of 2)]
[im 1/23]
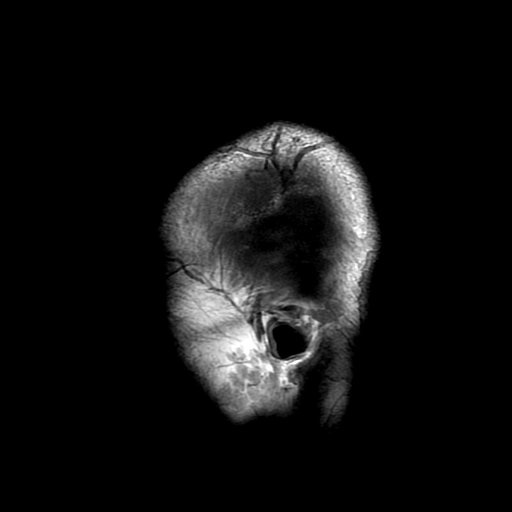
[im 23/23]
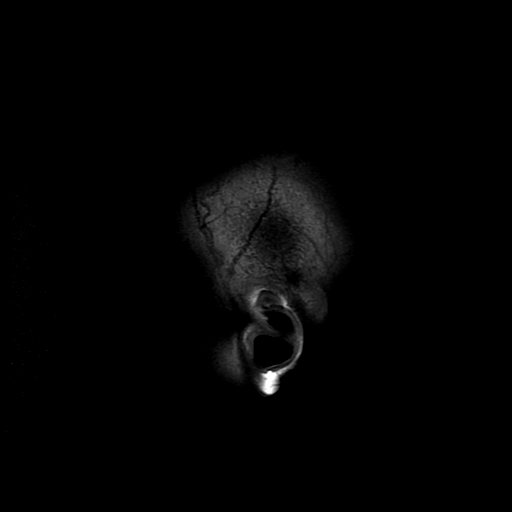

[Series 6: T2 · axial · 5.0mm · 0.47mm/px · z∈[-37,+112]mm · 2 of 26 slices shown (1 of 2)]
[im 1/26]
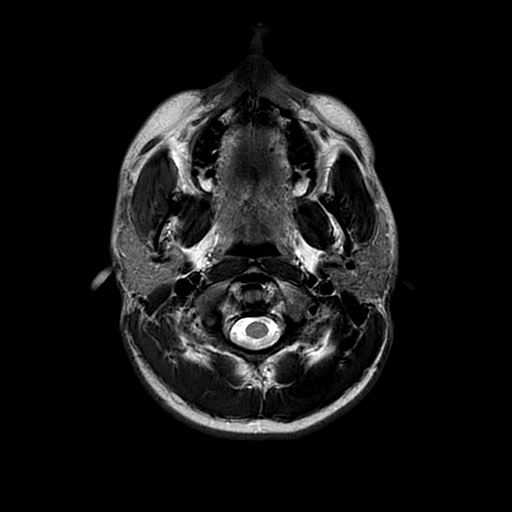
[im 26/26]
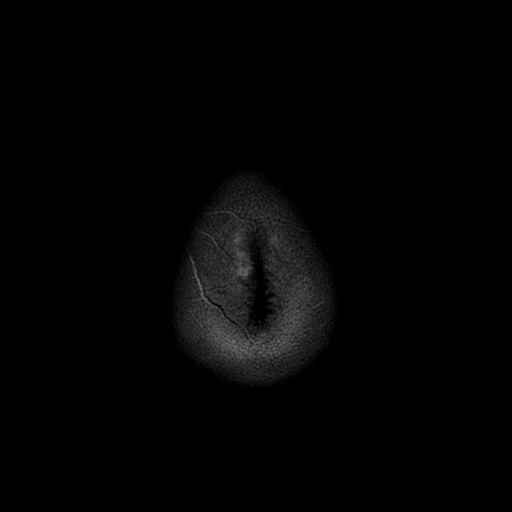

[Series 7: T2 · coronal · 5.0mm · 0.47mm/px · 2 of 30 slices shown (2 of 2)]
[im 1/30]
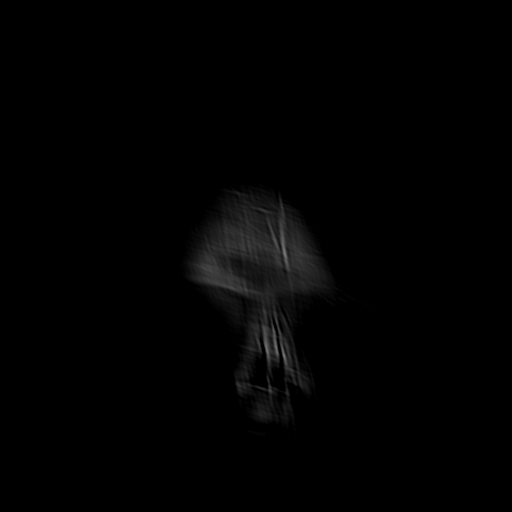
[im 30/30]
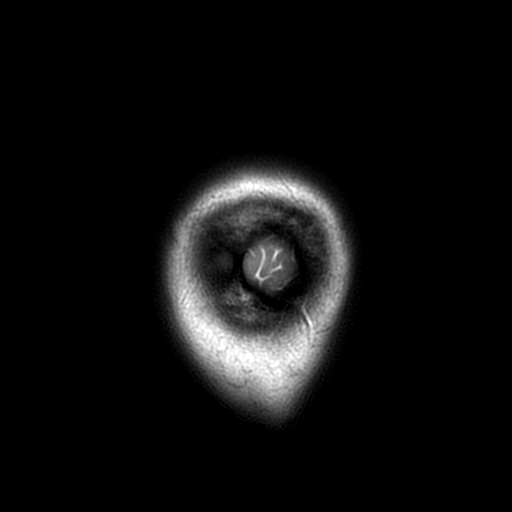

[Series 8: FLAIR · axial · 5.0mm · 0.47mm/px · z∈[-43,+105]mm · 2 of 26 slices shown (2 of 2)]
[im 1/26]
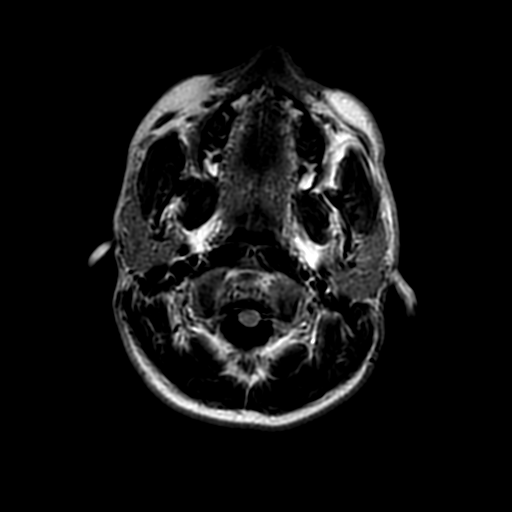
[im 26/26]
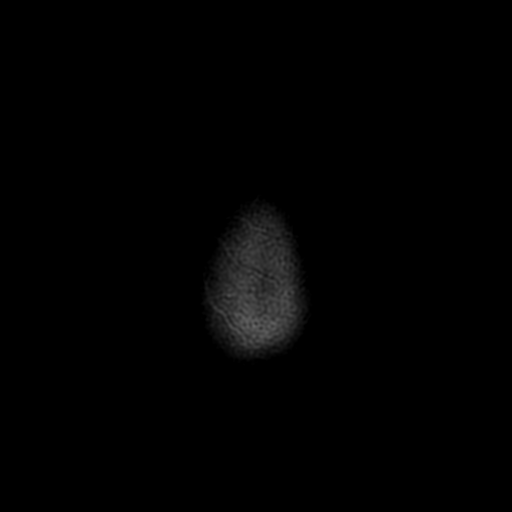

[Series 9: (person_name) · axial · 3.0mm · 0.47mm/px · z∈[-39,+10]mm · 3 of 100 slices shown]
[im 1/100]
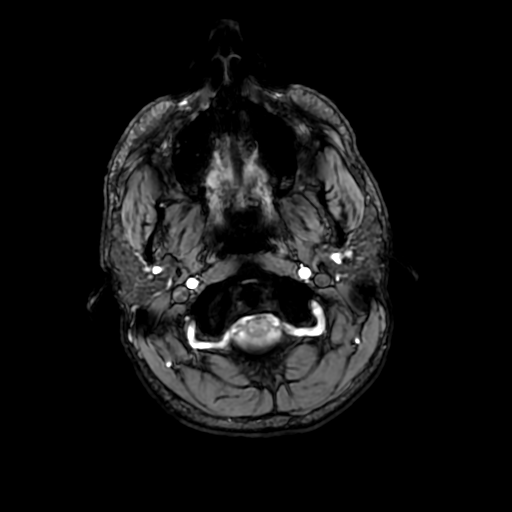
[im 17/100]
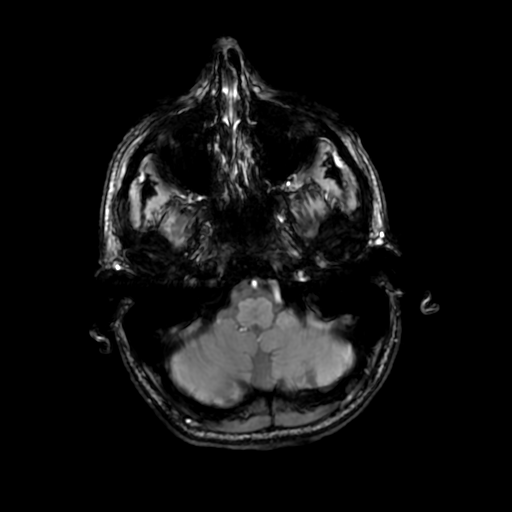
[im 34/100]
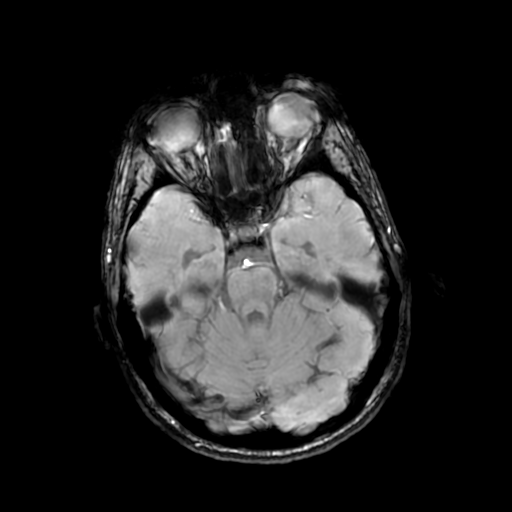

[Series 11: T2 fat-sat · coronal · 3.0mm · 0.43mm/px · 2 of 28 slices shown]
[im 1/28]
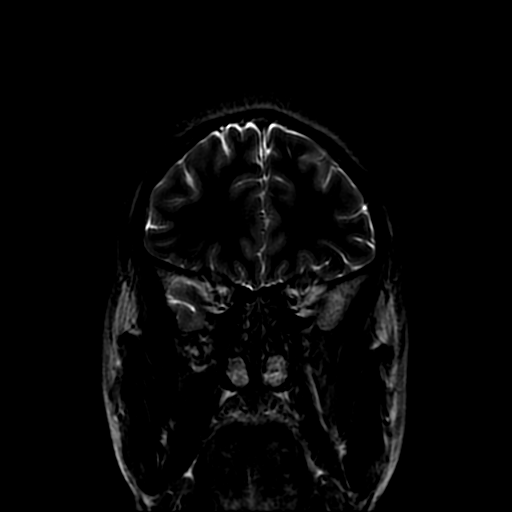
[im 28/28]
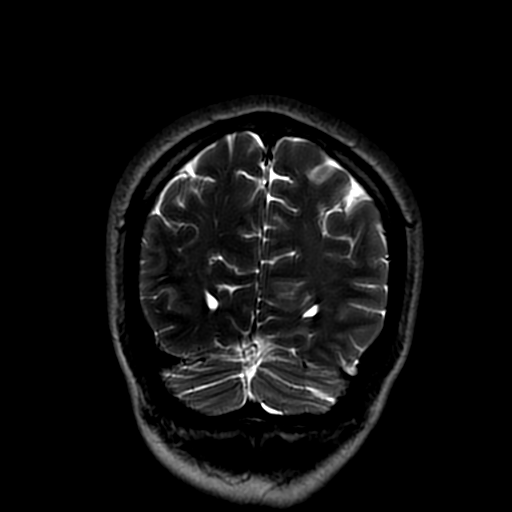

[Series 14: T1 · coronal · 5.0mm · 0.43mm/px · 2 of 30 slices shown]
[im 1/30]
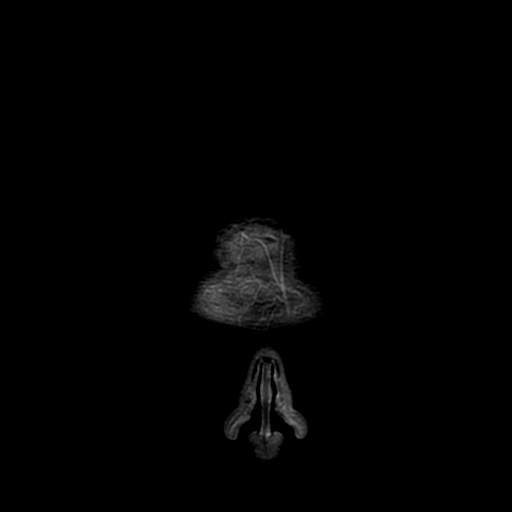
[im 30/30]
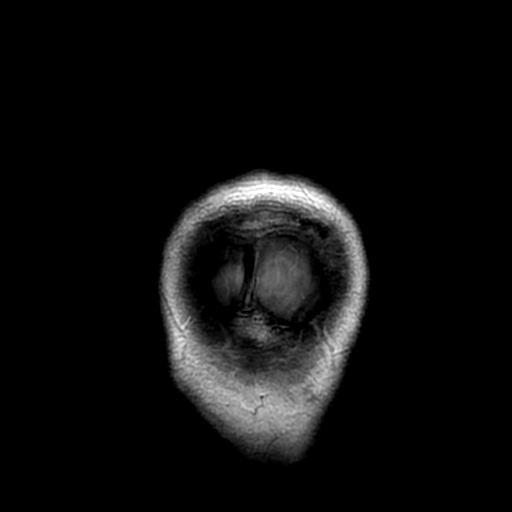

[Series 350: ADC · axial · 3.0mm · 0.94mm/px · z∈[-43,+102]mm · 3 of 50 slices shown (1 of 2)]
[im 1/50]
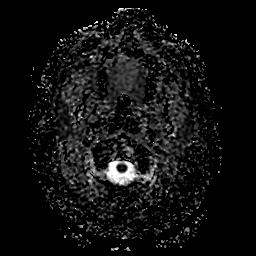
[im 25/50]
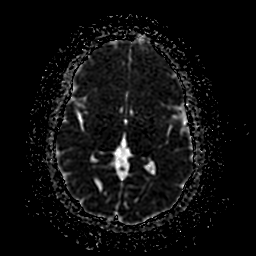
[im 50/50]
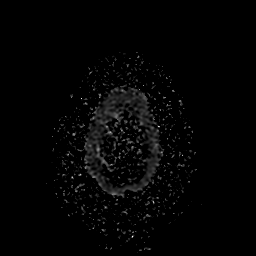

[Series 450: ADC · coronal · 4.0mm · 0.94mm/px · 2 of 36 slices shown (2 of 2)]
[im 1/36]
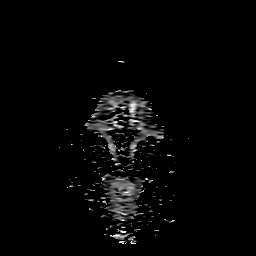
[im 36/36]
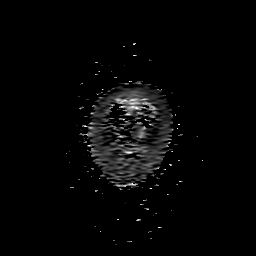

[30 of 48 positions shown; findings below may reference images not displayed]

FINDINGS: Brain: No acute infarction, hemorrhage, hydrocephalus, extra-axial
collection or mass lesion.

Normal enhancement postcontrast.

Vascular: Normal arterial flow void

Skull and upper cervical spine: Negative

Sinuses/Orbits: Negative

Other: None
IMPRESSION: Normal MRI head with contrast

## 2019-01-27 ENCOUNTER — Emergency Department
Admission: EM | Admit: 2019-01-27 | Discharge: 2019-01-27 | Disposition: A | Discharge status: Home / Self Care | Payer: Self-pay | Source: Home / Self Care

## 2019-01-27 ENCOUNTER — Other Ambulatory Visit: Payer: Self-pay

## 2019-01-27 ENCOUNTER — Emergency Department (INDEPENDENT_AMBULATORY_CARE_PROVIDER_SITE_OTHER): Payer: Self-pay

## 2019-01-27 ENCOUNTER — Encounter: Payer: Self-pay | Admitting: *Deleted

## 2019-01-27 DIAGNOSIS — M25511 Pain in right shoulder: Secondary | ICD-10-CM

## 2019-01-27 DIAGNOSIS — X58XXXA Exposure to other specified factors, initial encounter: Secondary | ICD-10-CM

## 2019-01-27 DIAGNOSIS — S42141A Displaced fracture of glenoid cavity of scapula, right shoulder, initial encounter for closed fracture: Secondary | ICD-10-CM

## 2019-01-27 MED ORDER — HYDROCODONE-ACETAMINOPHEN 5-325 MG PO TABS: 1.0000 | ORAL_TABLET | Freq: Four times a day (QID) | ORAL | 0 refills | Status: AC | PRN

## 2019-01-27 NOTE — ED Triage Notes (Signed)
Pt c/o RT shoulder pain x 1 day after having seizures 2 days in a row. He is unsure of injury.

## 2019-01-27 NOTE — ED Provider Notes (Signed)
[facility]    CSN: [id number] Arrival date & time: 01/27/19  1113      History   Chief Complaint Chief Complaint  Patient presents with   Shoulder Pain    HPI Blake Edwards is a 33 y.o. male.   The history is provided by the patient. No language interpreter was used.  Shoulder Pain Location:  Shoulder Shoulder location:  R shoulder Injury: yes   Time since incident:  2 days Mechanism of injury: fall   Fall:    Fall occurred:  From a bed Pain details:    Quality:  Aching   Onset quality:  Sudden   Timing:  Constant   Progression:  Worsening   Past Medical History:  Diagnosis Date   Seizures ([place])    Tobacco abuse     Patient Active Problem List   Diagnosis Date Noted   AKI (acute kidney injury) ([place]) 11/02/2016   Hypophosphatemia 11/02/2016   Seizures ([place])    Tobacco abuse    Seizure disorder ([place])     Past Surgical History:  Procedure Laterality Date   MOUTH SURGERY         Home Medications    Prior to Admission medications   Medication Sig Start Date End Date Taking? Authorizing Provider  HYDROcodone-acetaminophen (NORCO/VICODIN) 5-325 MG tablet Take 1 tablet by mouth every 6 (six) hours as needed for moderate pain. 01/27/19 01/27/20  Fransico Meadow, PA-C    Family History History reviewed. No pertinent family history.  Social History Social History   Tobacco Use   Smoking status: Former Smoker    Packs/day: 0.50    Years: 3.00    Pack years: 1.50    Types: Cigarettes   Smokeless tobacco: Never Used  Substance Use Topics   Alcohol use: Never    Frequency: Never   Drug use: Yes    Types: Marijuana     Allergies   Patient has no known allergies.   Review of Systems Review of Systems  All other systems reviewed and are negative.    Physical Exam Triage Vital Signs ED Triage Vitals  Enc Vitals Group     BP 01/27/19 1139 133/82     Pulse Rate 01/27/19 1139 73     Resp 01/27/19 1139 18     Temp  01/27/19 1139 98.5 F (36.9 C)     Temp Source 01/27/19 1139 Oral     SpO2 01/27/19 1139 97 %     Weight 01/27/19 1140 186 lb (84.4 kg)     Height --      Head Circumference --      Peak Flow --      Pain Score 01/27/19 1140 4     Pain Loc --      Pain Edu? --      Excl. in [place]? --    No data found.  Updated Vital Signs BP 133/82 (BP Location: Right Arm)    Pulse 73    Temp 98.5 F (36.9 C) (Oral)    Resp 18    Wt 84.4 kg    SpO2 97%    BMI 25.23 kg/m   Visual Acuity Right Eye Distance:   Left Eye Distance:   Bilateral Distance:    Right Eye Near:   Left Eye Near:    Bilateral Near:     Physical Exam Vitals signs and nursing note reviewed.  Constitutional:      Appearance: He is well-developed.  HENT:  Head: Normocephalic and atraumatic.  Eyes:     Conjunctiva/sclera: Conjunctivae normal.  Neck:     Musculoskeletal: Neck supple.  Cardiovascular:     Rate and Rhythm: Normal rate.     Heart sounds: No murmur.  Pulmonary:     Effort: Pulmonary effort is normal. No respiratory distress.  Abdominal:     Tenderness: There is no abdominal tenderness.  Musculoskeletal:        General: Swelling and tenderness present.     Comments: decreed range of motion right shoulder, pain with movement,   Skin:    General: Skin is warm and dry.  Neurological:     General: No focal deficit present.     Mental Status: He is alert.  Psychiatric:        Mood and Affect: Mood normal.      UC Treatments / Results  Labs (all labs ordered are listed, but only abnormal results are displayed) Labs Reviewed - No data to display  EKG   Radiology Dg Shoulder Right  Result Date: 01/27/2019 CLINICAL DATA:  Right shoulder pain. Seizure. EXAM: RIGHT SHOULDER - 2+ VIEW COMPARISON:  07/17/2010 FINDINGS: Contour deformity of the posterolateral aspect of the humeral head suggesting Hill-Sachs impaction fracture. There is a mildly displaced 10 mm fracture fragment emanating from the  anteroinferior glenoid rim suggesting a bony Bankart lesion. Glenohumeral joint alignment is maintained without dislocation. Glenohumeral and acromioclavicular joint spaces are preserved. Soft tissues appear within normal limits. IMPRESSION: Mildly displaced fracture emanating from the anteroinferior glenoid rim with an associated contour deformity of the superolateral humeral head. Findings suggest sequela of anterior shoulder dislocation. Glenohumeral joint is anatomically aligned. Electronically Signed   By: Duanne Guess M.D.   On: 01/27/2019 13:03    Procedures Procedures (including critical care time)  Medications Ordered in UC Medications - No data to display  Initial Impression / Assessment and Plan / UC Course  I have reviewed the triage vital signs and the nursing notes.  Pertinent labs & imaging results that were available during my care of the patient were reviewed by me and considered in my medical decision making (see chart for details).     MDM  Xray shows glenoid fx and humerus fx.  Pt may have dislocated shoulder when he fell.  Pt counseled on injury.  Pt placed in a sling and advised to follow up with Dr. Jena Gauss for evaluation Final Clinical Impressions(s) / UC Diagnoses   Final diagnoses:  None   Discharge Instructions   None    ED Prescriptions    Medication Sig Dispense Auth. Provider   HYDROcodone-acetaminophen (NORCO/VICODIN) 5-325 MG tablet Take 1 tablet by mouth every 6 (six) hours as needed for moderate pain. 16 tablet Elson Areas, [place]     I have reviewed the PDMP during this encounter.  An After Visit Summary was printed and given to the patient.    Elson Areas, [place] 01/27/19 1352

## 2020-09-23 IMAGING — DX DG SHOULDER 2+V*R*
3 series · 3 of 3 positions shown · non-contrast
Comparison: 07/17/2010

CLINICAL DATA: Right shoulder pain. Seizure.

EXAM:
RIGHT SHOULDER - 2+ VIEW

[shoulder grashey]
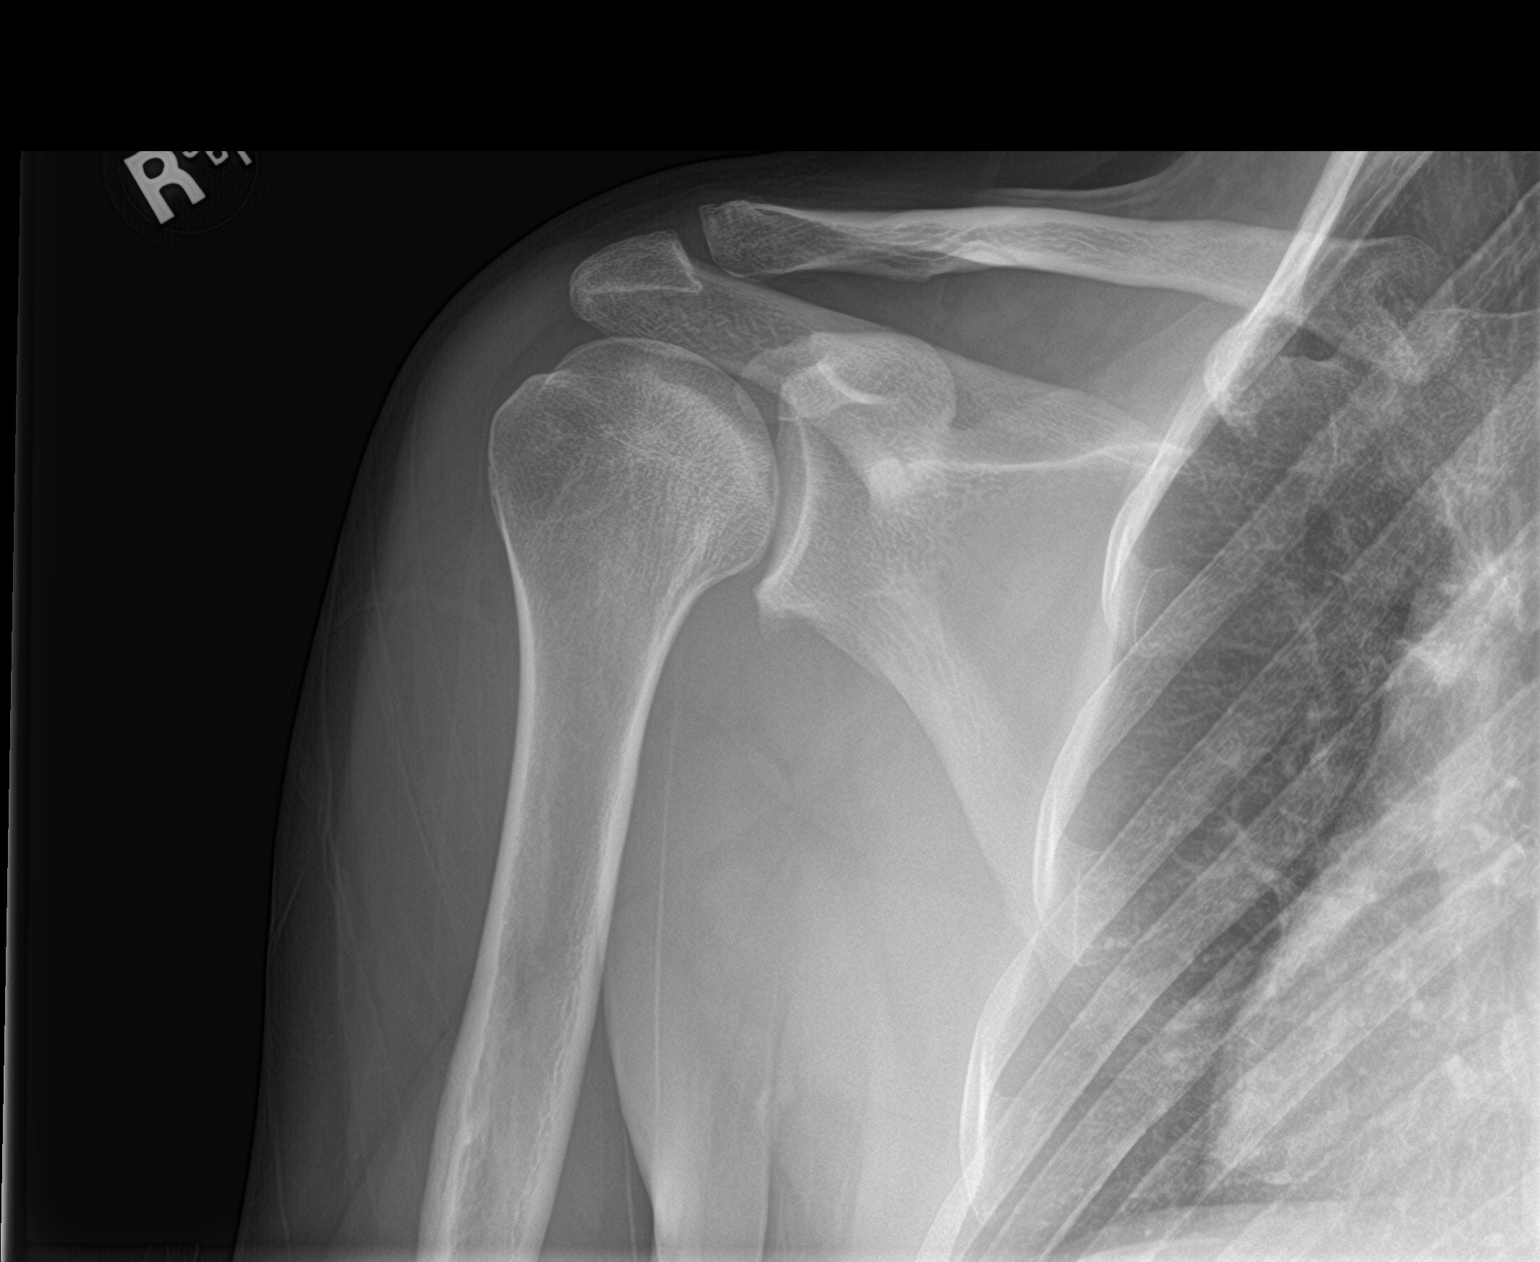

[shoulder y view]
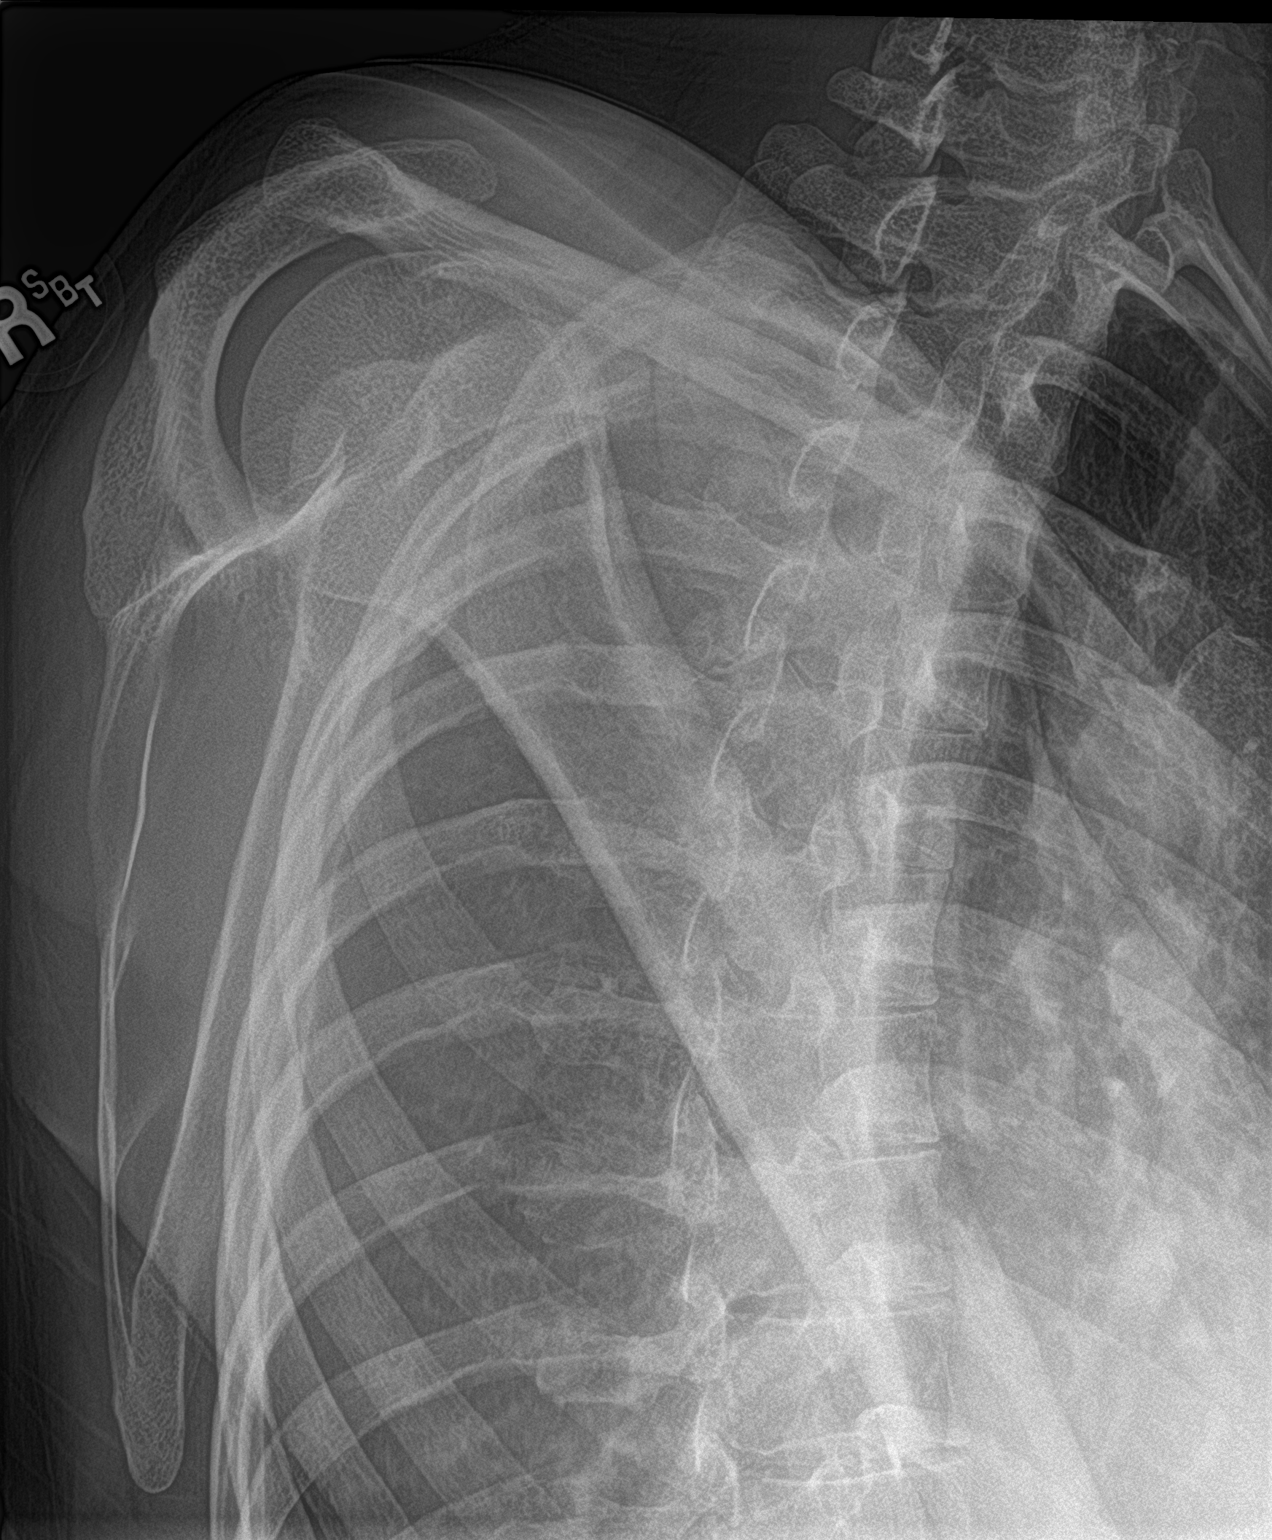

[shoulder axillary]
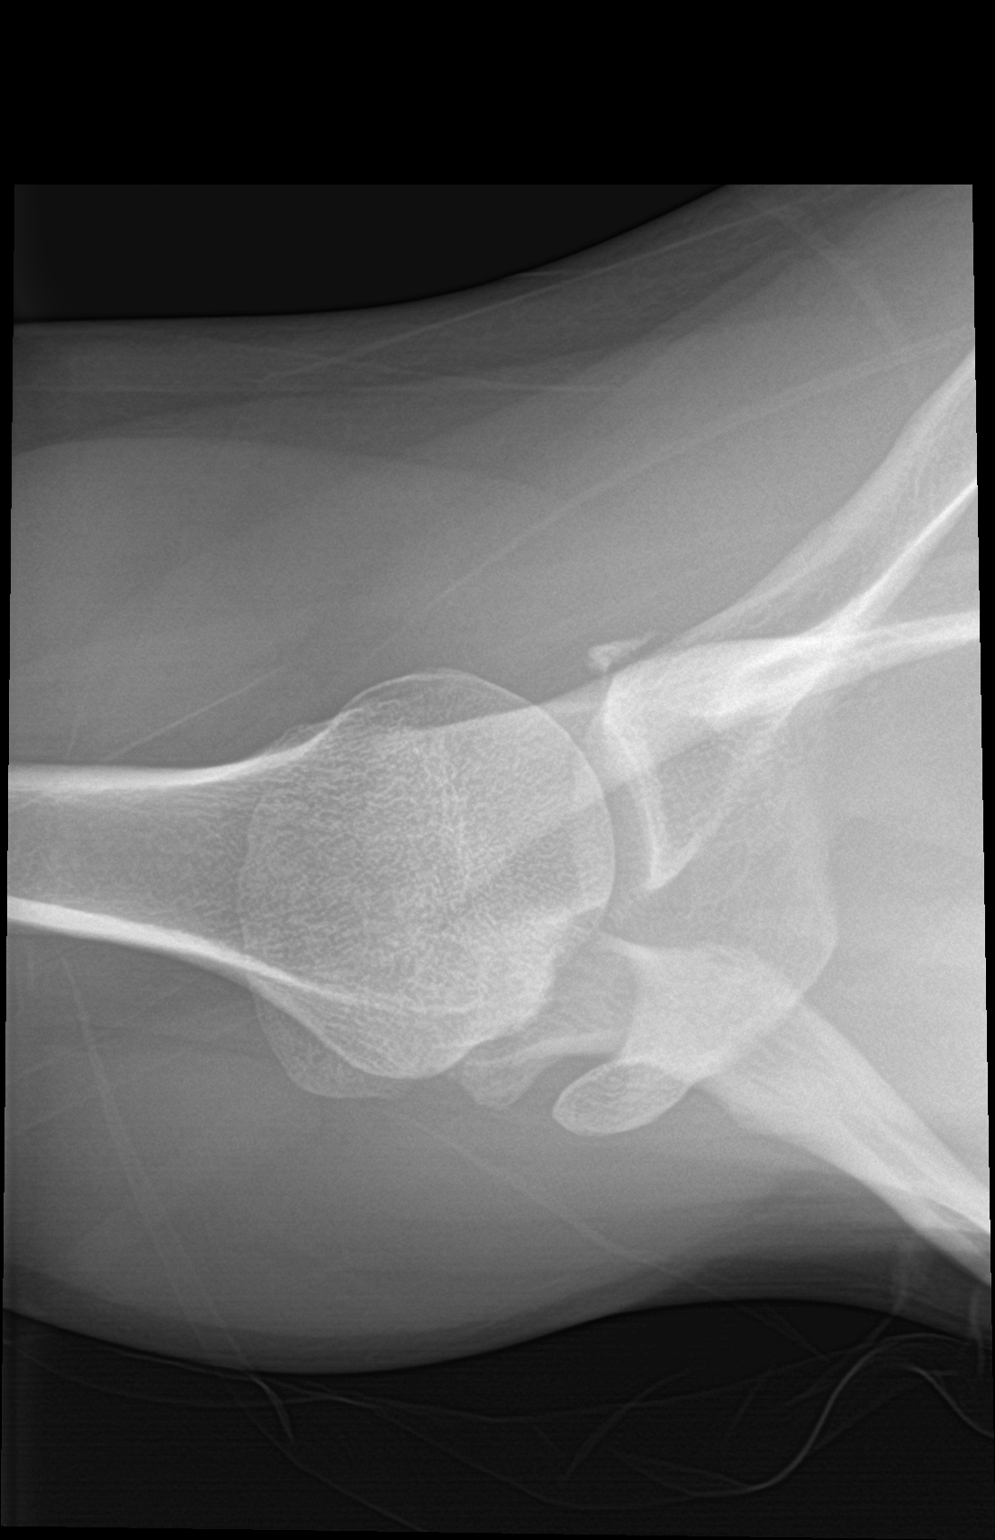

[3 of 3 positions shown; findings below may reference images not displayed]

FINDINGS: Contour deformity of the posterolateral aspect of the humeral head
suggesting Hill-Sachs impaction fracture. There is a mildly
displaced 10 mm fracture fragment emanating from the anteroinferior
glenoid rim suggesting a bony Bankart lesion. Glenohumeral joint
alignment is maintained without dislocation. Glenohumeral and
acromioclavicular joint spaces are preserved. Soft tissues appear
within normal limits.
IMPRESSION: Mildly displaced fracture emanating from the anteroinferior glenoid
rim with an associated contour deformity of the superolateral
humeral head. Findings suggest sequela of anterior shoulder
dislocation. Glenohumeral joint is anatomically aligned.

## 2022-01-25 DIAGNOSIS — Z419 Encounter for procedure for purposes other than remedying health state, unspecified: Secondary | ICD-10-CM | POA: Diagnosis not present

## 2022-02-25 DIAGNOSIS — Z419 Encounter for procedure for purposes other than remedying health state, unspecified: Secondary | ICD-10-CM | POA: Diagnosis not present

## 2022-03-28 DIAGNOSIS — Z419 Encounter for procedure for purposes other than remedying health state, unspecified: Secondary | ICD-10-CM | POA: Diagnosis not present

## 2022-04-26 DIAGNOSIS — Z419 Encounter for procedure for purposes other than remedying health state, unspecified: Secondary | ICD-10-CM | POA: Diagnosis not present

## 2022-05-27 DIAGNOSIS — Z419 Encounter for procedure for purposes other than remedying health state, unspecified: Secondary | ICD-10-CM | POA: Diagnosis not present

## 2022-06-26 DIAGNOSIS — Z419 Encounter for procedure for purposes other than remedying health state, unspecified: Secondary | ICD-10-CM | POA: Diagnosis not present

## 2022-07-27 DIAGNOSIS — Z419 Encounter for procedure for purposes other than remedying health state, unspecified: Secondary | ICD-10-CM | POA: Diagnosis not present

## 2022-08-26 DIAGNOSIS — Z419 Encounter for procedure for purposes other than remedying health state, unspecified: Secondary | ICD-10-CM | POA: Diagnosis not present

## 2022-09-26 DIAGNOSIS — Z419 Encounter for procedure for purposes other than remedying health state, unspecified: Secondary | ICD-10-CM | POA: Diagnosis not present

## 2022-10-27 DIAGNOSIS — Z419 Encounter for procedure for purposes other than remedying health state, unspecified: Secondary | ICD-10-CM | POA: Diagnosis not present

## 2022-11-26 DIAGNOSIS — Z419 Encounter for procedure for purposes other than remedying health state, unspecified: Secondary | ICD-10-CM | POA: Diagnosis not present

## 2022-12-27 DIAGNOSIS — Z419 Encounter for procedure for purposes other than remedying health state, unspecified: Secondary | ICD-10-CM | POA: Diagnosis not present

## 2023-01-26 DIAGNOSIS — Z419 Encounter for procedure for purposes other than remedying health state, unspecified: Secondary | ICD-10-CM | POA: Diagnosis not present

## 2023-02-26 DIAGNOSIS — Z419 Encounter for procedure for purposes other than remedying health state, unspecified: Secondary | ICD-10-CM | POA: Diagnosis not present

## 2023-03-29 DIAGNOSIS — Z419 Encounter for procedure for purposes other than remedying health state, unspecified: Secondary | ICD-10-CM | POA: Diagnosis not present

## 2023-04-26 DIAGNOSIS — Z419 Encounter for procedure for purposes other than remedying health state, unspecified: Secondary | ICD-10-CM | POA: Diagnosis not present

## 2023-06-07 DIAGNOSIS — Z419 Encounter for procedure for purposes other than remedying health state, unspecified: Secondary | ICD-10-CM | POA: Diagnosis not present

## 2023-07-07 DIAGNOSIS — Z419 Encounter for procedure for purposes other than remedying health state, unspecified: Secondary | ICD-10-CM | POA: Diagnosis not present

## 2023-08-07 DIAGNOSIS — Z419 Encounter for procedure for purposes other than remedying health state, unspecified: Secondary | ICD-10-CM | POA: Diagnosis not present

## 2023-09-06 DIAGNOSIS — Z419 Encounter for procedure for purposes other than remedying health state, unspecified: Secondary | ICD-10-CM | POA: Diagnosis not present

## 2023-10-07 DIAGNOSIS — Z419 Encounter for procedure for purposes other than remedying health state, unspecified: Secondary | ICD-10-CM | POA: Diagnosis not present

## 2023-11-07 DIAGNOSIS — Z419 Encounter for procedure for purposes other than remedying health state, unspecified: Secondary | ICD-10-CM | POA: Diagnosis not present

## 2024-01-07 DIAGNOSIS — Z419 Encounter for procedure for purposes other than remedying health state, unspecified: Secondary | ICD-10-CM | POA: Diagnosis not present
